# Patient Record
Sex: Female | Born: 1990 | Race: Black or African American | Hispanic: No | Marital: Single | Smoking: Former smoker
Health system: Southern US, Community
[De-identification: ages and names within clinical notes are randomized; demographics above are authoritative.]

## PROBLEM LIST (undated history)

## (undated) DIAGNOSIS — G35 Multiple sclerosis: Secondary | ICD-10-CM

## (undated) DIAGNOSIS — G8929 Other chronic pain: Secondary | ICD-10-CM

## (undated) HISTORY — PX: TONSILLECTOMY: SUR1361

## (undated) HISTORY — PX: TUBAL LIGATION: SHX77

## (undated) HISTORY — PX: CHOLECYSTECTOMY: SHX55

---
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
## 2010-07-29 ENCOUNTER — Observation Stay: Payer: Self-pay | Admitting: Surgery

## 2010-07-31 LAB — PATHOLOGY REPORT

## 2010-10-02 ENCOUNTER — Emergency Department: Payer: Self-pay | Admitting: Internal Medicine

## 2010-10-22 ENCOUNTER — Encounter: Payer: Self-pay | Admitting: Family Medicine

## 2010-10-28 ENCOUNTER — Encounter: Payer: Self-pay | Admitting: Family Medicine

## 2010-11-28 ENCOUNTER — Encounter: Payer: Self-pay | Admitting: Family Medicine

## 2010-12-29 ENCOUNTER — Encounter: Payer: Self-pay | Admitting: Family Medicine

## 2012-06-10 ENCOUNTER — Ambulatory Visit: Payer: Self-pay | Admitting: Ophthalmology

## 2012-06-27 ENCOUNTER — Inpatient Hospital Stay: Payer: Self-pay | Admitting: Obstetrics and Gynecology

## 2012-06-27 LAB — COMPREHENSIVE METABOLIC PANEL
Albumin: 2.6 g/dL — ABNORMAL LOW (ref 3.4–5.0)
Alkaline Phosphatase: 46 U/L — ABNORMAL LOW (ref 50–136)
Anion Gap: 8 (ref 7–16)
Bilirubin,Total: 0.2 mg/dL (ref 0.2–1.0)
Co2: 25 mmol/L (ref 21–32)
Creatinine: 0.68 mg/dL (ref 0.60–1.30)
EGFR (African American): >60
Glucose: 97 mg/dL (ref 65–99)
Potassium: 3.4 mmol/L — ABNORMAL LOW (ref 3.5–5.1)
SGPT (ALT): 44 U/L (ref 12–78)
Sodium: 141 mmol/L (ref 136–145)
Total Protein: 5.8 g/dL — ABNORMAL LOW (ref 6.4–8.2)

## 2012-06-27 LAB — LIPASE, BLOOD: Lipase: 66 U/L — ABNORMAL LOW (ref 73–393)

## 2012-06-27 LAB — AMYLASE: Amylase: 27 U/L (ref 25–115)

## 2012-06-28 LAB — CBC WITH DIFFERENTIAL/PLATELET
Basophil #: 0 10*3/uL (ref 0.0–0.1)
Basophil %: 0.1 %
Eosinophil #: 0 10*3/uL (ref 0.0–0.7)
Eosinophil %: 0.2 %
HGB: 9.6 g/dL — ABNORMAL LOW (ref 12.0–16.0)
Lymphocyte #: 1.7 10*3/uL (ref 1.0–3.6)
MCH: 28.8 pg (ref 26.0–34.0)
MCHC: 33 g/dL (ref 32.0–36.0)
MCV: 87 fL (ref 80–100)
Monocyte #: 0.9 x10 3/mm (ref 0.2–0.9)
Neutrophil #: 5.9 10*3/uL (ref 1.4–6.5)
Neutrophil %: 69.4 %
RBC: 3.35 10*6/uL — ABNORMAL LOW (ref 3.80–5.20)
RDW: 13.1 % (ref 11.5–14.5)

## 2012-06-28 LAB — URINALYSIS, COMPLETE
Bilirubin,UR: NEGATIVE
Blood: NEGATIVE
Glucose,UR: NEGATIVE mg/dL (ref 0–75)
Ketone: NEGATIVE
Ph: 9 (ref 4.5–8.0)
Protein: NEGATIVE
Specific Gravity: 1.01 (ref 1.003–1.030)
WBC UR: 2 /HPF (ref 0–5)

## 2012-06-28 LAB — PROTIME-INR: Prothrombin Time: 13 secs (ref 11.5–14.7)

## 2012-06-28 LAB — APTT: Activated PTT: 23.8 secs (ref 23.6–35.9)

## 2012-06-29 LAB — COMPREHENSIVE METABOLIC PANEL
Albumin: 2.3 g/dL — ABNORMAL LOW (ref 3.4–5.0)
Alkaline Phosphatase: 41 U/L — ABNORMAL LOW (ref 50–136)
BUN: 8 mg/dL (ref 7–18)
Creatinine: 0.53 mg/dL — ABNORMAL LOW (ref 0.60–1.30)
EGFR (African American): >60
EGFR (Non-African Amer.): >60
Glucose: 85 mg/dL (ref 65–99)
Potassium: 3.5 mmol/L (ref 3.5–5.1)
Sodium: 140 mmol/L (ref 136–145)
Total Protein: 5.4 g/dL — ABNORMAL LOW (ref 6.4–8.2)

## 2012-06-30 LAB — HEPATIC FUNCTION PANEL A (ARMC)
Albumin: 2.3 g/dL — ABNORMAL LOW (ref 3.4–5.0)
Alkaline Phosphatase: 43 U/L — ABNORMAL LOW (ref 50–136)
Bilirubin, Direct: 0.1 mg/dL (ref 0.00–0.20)
Bilirubin,Total: 0.4 mg/dL (ref 0.2–1.0)
SGOT(AST): 10 U/L — ABNORMAL LOW (ref 15–37)
SGPT (ALT): 33 U/L (ref 12–78)

## 2012-06-30 LAB — CBC WITH DIFFERENTIAL/PLATELET
Basophil %: 0.2 %
Eosinophil #: 0 10*3/uL (ref 0.0–0.7)
MCH: 28.6 pg (ref 26.0–34.0)
MCV: 86 fL (ref 80–100)
Neutrophil #: 6.8 10*3/uL — ABNORMAL HIGH (ref 1.4–6.5)
Neutrophil %: 73.2 %
RBC: 3.49 10*6/uL — ABNORMAL LOW (ref 3.80–5.20)
RDW: 13 % (ref 11.5–14.5)

## 2012-07-01 LAB — COMPREHENSIVE METABOLIC PANEL
Alkaline Phosphatase: 41 U/L — ABNORMAL LOW (ref 50–136)
BUN: 6 mg/dL — ABNORMAL LOW (ref 7–18)
Bilirubin,Total: 0.3 mg/dL (ref 0.2–1.0)
Calcium, Total: 7.7 mg/dL — ABNORMAL LOW (ref 8.5–10.1)
Chloride: 111 mmol/L — ABNORMAL HIGH (ref 98–107)
EGFR (African American): >60
Osmolality: 279 (ref 275–301)
Potassium: 3.3 mmol/L — ABNORMAL LOW (ref 3.5–5.1)
SGOT(AST): 11 U/L — ABNORMAL LOW (ref 15–37)
SGPT (ALT): 26 U/L (ref 12–78)
Total Protein: 5.3 g/dL — ABNORMAL LOW (ref 6.4–8.2)

## 2012-07-01 LAB — URINE CULTURE

## 2012-07-06 ENCOUNTER — Encounter: Payer: Self-pay | Admitting: Maternal & Fetal Medicine

## 2012-07-12 ENCOUNTER — Observation Stay: Payer: Self-pay | Admitting: Obstetrics and Gynecology

## 2012-07-12 LAB — URINALYSIS, COMPLETE
Blood: NEGATIVE
Glucose,UR: 50 mg/dL (ref 0–75)
Ketone: NEGATIVE
Nitrite: NEGATIVE
Protein: NEGATIVE
Specific Gravity: 1.014 (ref 1.003–1.030)
WBC UR: 1 /HPF (ref 0–5)

## 2012-07-12 LAB — FETAL FIBRONECTIN: Fetal Fibronectin: NEGATIVE

## 2012-07-27 ENCOUNTER — Encounter: Payer: Self-pay | Admitting: Maternal & Fetal Medicine

## 2012-08-17 ENCOUNTER — Encounter: Payer: Self-pay | Admitting: Maternal & Fetal Medicine

## 2012-09-09 ENCOUNTER — Ambulatory Visit: Payer: Self-pay | Admitting: Neurology

## 2012-09-13 ENCOUNTER — Observation Stay: Payer: Self-pay

## 2012-09-13 LAB — FETAL FIBRONECTIN
Appearance: NORMAL
Fetal Fibronectin: NEGATIVE

## 2012-09-13 LAB — URINALYSIS, COMPLETE
Protein: NEGATIVE
Specific Gravity: 1.004 (ref 1.003–1.030)

## 2012-09-17 ENCOUNTER — Inpatient Hospital Stay: Payer: Self-pay | Admitting: Obstetrics and Gynecology

## 2012-09-19 ENCOUNTER — Ambulatory Visit: Payer: Self-pay | Admitting: Neurology

## 2012-09-20 LAB — BASIC METABOLIC PANEL
Chloride: 108 mmol/L — ABNORMAL HIGH (ref 98–107)
Creatinine: 0.62 mg/dL (ref 0.60–1.30)
EGFR (African American): >60
Glucose: 155 mg/dL — ABNORMAL HIGH (ref 65–99)
Osmolality: 275 (ref 275–301)
Sodium: 137 mmol/L (ref 136–145)

## 2012-10-02 ENCOUNTER — Observation Stay: Payer: Self-pay | Admitting: Obstetrics and Gynecology

## 2012-10-06 ENCOUNTER — Inpatient Hospital Stay: Payer: Self-pay | Admitting: Obstetrics and Gynecology

## 2012-10-06 LAB — CBC WITH DIFFERENTIAL/PLATELET
Basophil #: 0 10*3/uL (ref 0.0–0.1)
Eosinophil #: 0.1 10*3/uL (ref 0.0–0.7)
HGB: 11.5 g/dL — ABNORMAL LOW (ref 12.0–16.0)
Lymphocyte #: 1.3 10*3/uL (ref 1.0–3.6)
MCHC: 33 g/dL (ref 32.0–36.0)
Monocyte %: 10.6 %
Neutrophil %: 73.2 %
WBC: 8.7 10*3/uL (ref 3.6–11.0)

## 2012-10-06 LAB — GC/CHLAMYDIA PROBE AMP

## 2012-10-06 LAB — RAPID HIV-1/2 QL/CONFIRM: HIV-1/2,Rapid Ql: NEGATIVE

## 2012-10-07 LAB — HEMATOCRIT: HCT: 29.8 % — ABNORMAL LOW (ref 35.0–47.0)

## 2012-11-16 ENCOUNTER — Ambulatory Visit: Payer: Self-pay | Admitting: Obstetrics and Gynecology

## 2012-11-16 LAB — HEMOGLOBIN: HGB: 12 g/dL (ref 12.0–16.0)

## 2012-11-16 LAB — PREGNANCY, URINE: Pregnancy Test, Urine: NEGATIVE m[IU]/mL

## 2012-11-20 ENCOUNTER — Ambulatory Visit: Payer: Self-pay | Admitting: Obstetrics and Gynecology

## 2012-12-25 ENCOUNTER — Ambulatory Visit: Payer: Self-pay | Admitting: Gastroenterology

## 2013-03-04 ENCOUNTER — Emergency Department: Payer: Self-pay | Admitting: Emergency Medicine

## 2013-06-07 ENCOUNTER — Emergency Department: Payer: Self-pay | Admitting: Emergency Medicine

## 2014-06-05 ENCOUNTER — Emergency Department: Payer: Self-pay | Admitting: Emergency Medicine

## 2014-08-19 NOTE — Discharge Summary (Signed)
PATIENT NAME:  Jillian Wells, Jillian Wells MR#:  [id number] DATE OF BIRTH:  20-Oct-1990  DATE OF ADMISSION:  09/17/2012 DATE OF DISCHARGE:  09/20/2012   HOSPITAL COURSE: This is a 24 year old gravida 2, para 1 patient with known multiple sclerosis, admitted for intravenous steroids for worsening optic neuritis.  Dr. Judie Bonus of neurology and hospitalist managing the patient while in the hospital. The patient did undergo daily nonstress test, which were reassuring normal fetal well-being. The patient was noted to have some intermittent contractions; however, her cervix was checked twice and did not show any cervical dilation The patient is discharged to home in good condition. She will be discharged  on prednisone as written by the hospitalist and will follow up with Dr. Sherryll Burger. Otherwise, she will continue prenatal care at [facility]. She has an appointment Wednesday for nonstress test this.    ____________________________ Suzy Bouchard, MD tjs:cc D: 09/20/2012 11:34:15 ET T: 09/20/2012 18:52:45 ET JOB#: [id number]  cc: Suzy Bouchard, MD, <Dictator> Suzy Bouchard MD ELECTRONICALLY SIGNED 09/24/2012 30:86

## 2014-08-19 NOTE — Consult Note (Signed)
PATIENT NAME:  Jillian Wells, Jillian Wells MR#:  [id number] DATE OF BIRTH:  [birth year]  DATE OF CONSULTATION:  09/19/2012  REFERRING PHYSICIAN:   CONSULTING PHYSICIAN:  Pauletta Browns, MD  BRIEF HISTORY OF PRESENT ILLNESS:  This is a 24 year old, left right-handed, African American female with past medical history of optic neuritis, newly diagnosed multiple sclerosis. First onset of optic neuritis in 05/2012 and has a new onset of left visual loss since on 08/2012. For current treatment she is receiving Solu-Medrol 500 mg twice a day and is to follow up with Dr. Sherryll Burger for the rest of the treatment.   PHYSICAL EXAMINATION:  VITAL SIGNS:  Include a temperature 98.4, pulse 105, blood pressure 128/82, heart rate 97.  MENTAL STATUS EXAMINATION:  She is alert, awake, oriented, follows 2-step commands. She is able to tell me the President of the [place]. Speech is fluent, attention and concentration are intact. Cranial nerve examination, she has anterior pupilar defect on the left eye. She has loss of vision in the left eye and absolutely no visual perception. On the right eye, visual fields appear to be intact. Visual sensation appears to be intact. Face symmetrical. Tongue is midline. Uvula elevates symmetrically. Motor strength 5/5 bilateral upper and lower extremities. Reflexes symmetrical, but diminished throughout, 1+. Sensation intact to light touch and temperature. Coordination:  Finger-to-nose intact. Gait not assessed.   IMPRESSION:  A 24 year old female with recurrent optic neuritis and multiple sclerosis being treated with Solu-Medrol.   PLAN:  Currently on Solu-Medrol 500 mg twice a day. This is her fourth dose. The last dose is going to be tomorrow morning, and she is going to be discharged with a prednisone 60 mg a day for a week, and then she is going to follow up with Dr. Sherryll Burger as outpatient. As outpatient, she is most likely going to be started on Tysabri, but prior to that she is going to be  sent for a JC virus antibody. Unfortunately JC viruses can be positive for close to 50% of the general population. There is a high risk of PML with Tysabri and positive JC virus, which is high chance of developing PML infusing Tysabri for 2 or more years.  It was a pleasure seeing this patient. Please call with any questions and the patient is to follow up with Dr. Sherryll Burger as above.   ____________________________ Pauletta Browns, MD yz:jm D: 09/19/2012 11:37:55 ET T: 09/19/2012 12:33:25 ET JOB#: [id number]  cc: Pauletta Browns, MD, <Dictator> Pauletta Browns MD ELECTRONICALLY SIGNED 09/25/2012 14:38

## 2014-08-19 NOTE — Consult Note (Signed)
Chief Complaint:  Subjective/Chief Complaint seen for right upper quadrant pain. less nausea, no vomiting.  currently pain is "6/10".  appears comfortable.  tolerating clears.   VITAL SIGNS/ANCILLARY NOTES: **Vital Signs.:   03-Mar-14 16:34  Vital Signs Type Routine  Temperature Temperature (F) 98  Celsius 36.6  Temperature Source oral  Pulse Pulse 80  Pulse source if not from Vital Sign Device apical  Respirations Respirations 18  Systolic BP Systolic BP 673  Diastolic BP (mmHg) Diastolic BP (mmHg) 73  Mean BP 88  Pulse Ox % Pulse Ox % 98  Pulse Ox Activity Level  At rest  Oxygen Delivery Room Air/ 21 %   Brief Assessment:  Cardiac Regular   Respiratory clear BS   Gastrointestinal details normal Soft  Nondistended  No masses palpable  Bowel sounds normal  No rebound tenderness  tender epigastrum to right upper quadrant.   Lab Results: Hepatic:  03-Mar-14 05:35   Bilirubin, Total 0.4  Alkaline Phosphatase  41  SGPT (ALT) 38  SGOT (AST) 22  Total Protein, Serum  5.4  Albumin, Serum  2.3  Routine Chem:  03-Mar-14 05:35   Glucose, Serum 85  BUN 8  Creatinine (comp)  0.53  Sodium, Serum 140  Potassium, Serum 3.5  Chloride, Serum  110  CO2, Serum 24  Calcium (Total), Serum  7.8  Osmolality (calc) 277  eGFR (African American) >60  eGFR (Non-African American) >60 (eGFR values <50m/min/1.73 m2 may be an indication of chronic kidney disease (CKD). Calculated eGFR is useful in patients with stable renal function. The eGFR calculation will not be reliable in acutely ill patients when serum creatinine is changing rapidly. It is not useful in  patients on dialysis. The eGFR calculation may not be applicable to patients at the low and high extremes of body sizes, pregnant women, and vegetarians.)  Anion Gap  6   Radiology Results: [place]    02-Mar-14 04:39, [place]dom[place]  [place]dom[place] Survey   REASON FOR EXAM:    abdominal pain  COMMENTS:   Body  Site: GB and Fossa, CBD, Head of Pancreas    PROCEDURE: [place] - [place]DOM[place] LIMITED SURVEY  - Jun 28 2012  4:39AM     RESULT: Comparison: None    Technique: Multiple gray-scale and color-flow Doppler images of the right   upper quadrant are presented for review.    Findings:    The liver is increased in echogenicity. The liver is enlarged measuring   19.8 cm in length. The liver is without evidence of a focal hepatic   lesion.   Prior cholecystectomy. There is no intra- or extrahepatic biliary ductal   dilatation. The common duct measures 3.1 mm in maximal diameter.     The visualized portion of the pancreas is normal in echogenicity.    IMPRESSION:     Prior cholecystectomy.    Hepatomegaly and hepatic steatosis.    Dictation Site: 1        Verified By: HJennette Banker M.D., MD    02-Mar-14 14:59, [place]ney Bilateral  [place] Bilateral   REASON FOR EXAM:    right costovertebral angle pain  COMMENTS:       PROCEDURE: [place] - [place]KIDNEY  - Jun 28 2012  2:59PM     RESULT: Comparison: None    Technique and findings: Multiple gray-scale and color doppler images of   the kidneys were obtained.     The right  kidney measures 13.3 x 6.9 x 5.2 cm and the left kidney   measures 13.6 x 5.6 x 5.4 cm. The kidneys are normal in echogenicity.   There is no left hydronephrosis. There is mild right pelvic caliectasis.    There are no echogenic foci.There are no renal masses. There is no free   fluid in the region of the renal fossa. Bilateral ureteral jets are noted.  IMPRESSION:      Mild right pelvicaliectasis.    Dictation Site: 1        Verified By: Jennette Banker, M.D., MD   Assessment/Plan:  Assessment/Plan:  Assessment 1) right upper quadrant, epigastric pain of uncertain etiology.  labs uninformative, US showing mild right pelviiectasis without obstruction. 2) 22 week grav.   Plan 1) will start miralax daily for constipation 2) bentyl 10 mg po q 8 hours prn pain 3)  recheck labs am case/above discussed with Dr Amalia Hailey.   Electronic Signatures: Loistine Simas (MD)  (Signed 03-Mar-14 19:55)  Authored: Chief Complaint, VITAL SIGNS/ANCILLARY NOTES, Brief Assessment, Lab Results, Radiology Results, Assessment/Plan   Last Updated: 03-Mar-14 19:55 by Loistine Simas (MD)

## 2014-08-19 NOTE — Discharge Summary (Signed)
PATIENT NAME:  [name], Jillian Wells MR#:  [id number] DATE OF BIRTH:  Jun 24, 1990  DATE OF ADMISSION:  06/27/2012 DATE OF DISCHARGE:  07/01/2012  ADMISSION DIAGNOSES: 1.  Twenty-two week gestation.  2.  Abdominal pain.  PRIMARY CAREGIVER:  Jennell Corner, MD   CONSULTATIONS: Gastroenterology - Barnetta Chapel, MD   HOSPITAL COURSE: The patient was admitted on March 1st with right upper quadrant pain similar to gallbladder pain and found to be status post cholecystectomy.  Ultrasound was unremarkable. She had persistent pain throughout her hospitalization, eased significantly over the 4 days. Labs normal. No evidence of ureteral issue by ultrasound. Kidney function appeared normal. GI function appeared normal.   Per GI, she was discharged home with pain management and to follow up with more specific radiological study should pain persist status post pregnancy.  ____________________________ Reatha Harps. Logan Bores, MD rle:sb D: 07/10/2012 07:29:44 ET T: 07/10/2012 07:41:19 ET JOB#: [id number]  cc: Ricky L. Logan Bores, MD, <Dictator> Augustina Mood MD ELECTRONICALLY SIGNED 07/10/2012 17:57

## 2014-08-19 NOTE — Consult Note (Signed)
PATIENT NAME:  Jillian Wells, Jillian Wells MR#:  [id number] DATE OF BIRTH:  [birth year]  DATE OF CONSULTATION:  09/17/2012   CONSULTING PHYSICIAN:  Makaiya Geerdes P. Juliene Pina, MD  PRIMARY CARE PHYSICIAN:  None.  REFERRING PHYSICIAN:  Dr. Veatrice Bourbon  REASON FOR REQUEST:  Optic neuritis.   REPORT OF CONSULTATION:  This is a 24 year old, gravida 2, para 1 female, who is [redacted] weeks pregnant, who has been having issues with her left eye for the past several weeks since Mother's Day. She was seen by Dr. Sherryll Burger. She was given prednisone for a week. However, she continues to have no vision from her left eye.  The diagnosis was optic neuritis. Hospitalist is consulted for management while hospitalized.  I have spoken with Dr. Sherryll Burger, who recommends Solu-Medrol 500 mg IV q. 12 hours x 3 days.   REVIEW OF SYSTEMS: CONSTITUTIONAL:  No fever, fatigue, weakness. She has had weight gain, basically due to her pregnancy.  EYES:  She is no vision in the left eye.  EARS, NOSE, THROAT:  No ear pain, hearing loss, seasonal allergies, snoring, postnasal drip.  RESPIRATORY: No cough, wheezing, hemoptysis, dyspnea, COPD.  CARDIOVASCULAR: No chest pain, orthopnea, edema, arrhythmia, dyspnea on exertion, palpitations, syncope.  GASTROINTESTINAL:  No nausea, vomiting, diarrhea, abdominal pain, melena, ulcers, GERD. GENITOURINARY:   No dysuria or hematuria.  ENDOCRINE:  No polyuria, polydipsia. HEMATOLOGIC/LYMPHATIC:  No anemia, easy bruising.  SKIN:  No rash or lesions.  MUSCULOSKELETAL:  No pain in the shoulders or knees. No limited activity.  NEUROLOGIC:  No history of CVA, TIA, seizures.  PSYCHIATRIC:  No history of anxiety or depression.  PAST MEDICAL HISTORY:  Multiple sclerosis.   MEDICATIONS:  Prenatal vitamins.   PAST SURGICAL HISTORY:  1.  Cholecystectomy.  2.  Tonsillectomy.   ALLERGIES:  No known drug allergies.   SOCIAL HISTORY:  No tobacco, alcohol or drug use. She quit smoking last October.   FAMILY HISTORY:  Positive  for hypertension.   PHYSICAL EXAMINATION: VITAL SIGNS:  Temperature 98.2, pulse is 102, respirations 20, blood pressure 136/82, 97% on room air.  GENERAL:  The patient is alert, oriented, not in acute distress.  HEENT:  Head is atraumatic. Pupils are round and reactive. She cannot see from the left eye.  Oropharynx is clear. No exudates are noted. Mucous membranes are moist.  NECK:  Supple, without JVD, carotid bruit, or enlarged thyroid.   CARDIOVASCULAR: Tachycardia without murmur, gallops or rubs. PMI is hard to palpate due to body habitus.  LUNGS:  Clear to auscultation without crackles, rales, rhonchi or wheezing. Symmetrical rise and fall of the chest.  ABDOMEN:  She has a pregnant belly. Denied appreciated bowel sounds due to her pregnancy. EXTREMITIES:  No clubbing, cyanosis, edema.  NEUROLOGIC:  Cranial nerves II through XII are intact, with the exception of the left eye, from which she is unable to see. MUSCULOSKELETAL:   Strength 5/5 in all extremities.   LABORATORIES:   No laboratories at this time.   ASSESSMENT AND PLAN:  A 24 year old female with a history of multiple sclerosis, who presents with optic neuritis.   1.  Optic neuritis secondary to MS flare. Per Dr. Sherryll Burger, we will continue with Solu-Medrol 500 mg IV q. 12 hours x 3 days. It should be noted,  she says that during her last hospitalization when she received Solu-Medrol, on the third day she did have some abdominal pain that radiated to the back, so we will continue to monitor for these symptoms.  2.  Tachycardia, likely secondary to pregnancy and anxiety from being in the hospital. Will monitor carefully.   3.   The patient is FULL CODE STATUS.   Thank you for allowing [place] to participate in the care of this patient. We will continue to follow.   Time spent on this consult:  45 minutes.   ____________________________ Janyth Contes. Juliene Pina, MD spm:mr D: 09/17/2012 17:41:15 ET T: 09/17/2012 19:08:09  ET JOB#: [id number]  cc: Kyarra Vancamp P. Juliene Pina, MD, <Dictator> Janyth Contes Jamarian Jacinto MD ELECTRONICALLY SIGNED 09/17/2012 21:49

## 2014-08-19 NOTE — Consult Note (Signed)
Brief Consult Note: Diagnosis: right upper quadrant pain.   Patient was seen by consultant.   Consult note dictated.   Recommend further assessment or treatment.   Orders entered.   Discussed with Attending MD.   Comments: Please see fullGI consult.  Patient brought to the hospital with c/o ruq pain starting acutely 3 days ago the day after starting treatment for recent Dx of multiple sclerosis. US showing hepatomegally and fatty liver, lfts normal, cbc normal.  Will check ua and renal US in addition ot current evaluation.  Patietn is comfortable and in no acute distress.  Recommend also involving her neurologist in her care tomorrow (Dr Cristopher Peru).  Following.  Disucssed with Dr Feliberto Gottron.  Electronic Signatures: Barnetta Chapel (MD)  (Signed 02-Mar-14 14:58)  Authored: Brief Consult Note   Last Updated: 02-Mar-14 14:58 by Barnetta Chapel (MD)

## 2014-08-19 NOTE — Consult Note (Signed)
No Known Allergies:    Impression 24 y/o f G2 P1 here with optic neuritis flareof MS   Plan per dr Sherryll Burger neuro solumderol 50 mGIV q12 she says last time she got this the last day she had abdominal pain/[place] will monitor  thank yuo will follow   Electronic Signatures for Addendum Section:  Adrian Saran (MD) (Signed Addendum [phone] 17:41)  [phone]   Electronic Signatures: Adrian Saran (MD)  (Signed 22-May-14 17:36)  Authored: Home Medications, Allergies, Impression/Plan   Last Updated: 22-May-14 17:41 by Adrian Saran (MD)

## 2014-08-19 NOTE — Consult Note (Signed)
Referral Information:  Reason for Referral 24 yo G2P1001 at 29/4 weeks by LMP (01/23/12) c/w [place] at [facility] on 06/16/12; measurements were 20w 5 days.  She has a new diagnosis of Multiple Sclerosis, BMI 40, chronic hypertension, migraines, asthma, fatty liver and chronic right upper quadrant pain.  Her RUQ pain has continued to be severe (describes background 3/10 pain and daily 9/10 pain levels) She continues to use Vicodan q 4-6 hours daily depending on pain severity.  She met with GI but was told no further intervention/evaluation (normal lfts and liver ultrasound) could occur at this time due to pregnancy concerns.  She's here with her mom today and they are both somewhat frustrated because of her chronic, severe, daily pain.  She is unable to hug/interact normally with her 45 year old daughter due to the pain and is concerned about her persistent need for narcotics in order to function daily.   Referring Physician [facility]   Prenatal Hx MS--diagnosed 05/2012 following eppisode of acute onset left periorbital pain, blurred vision and left side weakness---MRI demonstrated periventricular hyperintensity lesions perpendicular to corpus callosum and juxtacortical lesions, multpile lesions enhanced with gadolinium.  She received Solumedrol 135m bid x 3 days, followed by a prednisone taper. She reports improvement in her symptoms.  She has follow up with her neurologist next month.   Past Obstetrical Hx 07/2007, 38 weeks, female, 6lbs 7 oz, complicated by preeclampsia   Home Medications: Medication Instructions Status  PreNata Prenatal Multivitamins oral tablet 1 tab(s) orally once a day Active  Vitamin D3 1000 intl units oral tablet 1 tab(s) orally once a day Active   Allergies:   No Known Allergies:   Vital Signs/Notes:  Nursing Vital Signs: **Vital Signs.:   21-Apr-14 09:22  Vital Signs Type Routine  Temperature Temperature (F) 97  Celsius 36.1  Temperature Source oral  Pulse  Pulse 86  Respirations Respirations 16  Systolic BP Systolic BP 1259 Diastolic BP (mmHg) Diastolic BP (mmHg) 57  Mean BP 83   Perinatal Consult:  PMed Hx Rubella Immune, Hx of varicella   Past Medical History cont'd 2. Chronic hypertension--?preeclampsia--she reports use of antihypertensive therapy following pregnancy complicated by preeclampsia, however, has not required any treatment since. 3. Fatty liver/ruq pain--normal lfts 06/26/12.  Ultrasound from 07/01/12 demonstrated hepatomegaly (19.8 cm in length), no focal hepatic lesions noted, no ductal dilation, +hepatic steatosis. see above for current complaints.  4. Asthma--stable 5. 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  We focused on her active issue of continued, right upper quadrant pain. Please see her previous mfm consultation for recommendations related to additional issues below:  1. MS--We addressed the general impact of pregnancy on MS--typically women experience improvement in clinical symptoms and may experience flares postpartum.  She reports improvement in her visual symptoms.  She received steroids and we addressed their  common use in pregnancy and overall safety.  2. BMI 40--we addressed concerns associated with obesity in pregnancy and reviewed IOM weight gain goals (15-20 lbs) in  pregnancy.   We discussed risks for gestational diabetes and pregnancy surveillance (as outlined below).  3. Hypertension, mild--she was familiar with the concerns assoicated with chronic hypertension in pregnancy. She had preeclampsia in her last pregnancy and was familiar with the disease.  We addressed pregnancy surveillance for growth restriction with serial growth ultrasounds and antenatal testing in the third trimester.  4. Chronic right upper quadrant pain on narcotics for pain relief.  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   5. Fatty liver--possibly due to obesity.  This condition is not associated with an increased risk for pregnancy related live disease (eg. AFLP) 6. Asthma-stable   Recommendations 1.  She may benefit from referral back to her primary surgeon for their opinion.  2.  I agree with your concerns related to this persistent post-cholecystectomy pain/ symptom.  I have referred her to [facility] to obtain their input.  She is scheduled to see them next week.  They have consulted with many of our pregnant patients in the past, and will evaluate her as needed for underlying pathology.  They can contact our maternal fetal medicine group while she is at [facility] if questions arise related to any additional testing or procedures needed.  I have advised [place] to go to the hospital if she experiences persistent, severe (9/10) pain.  She is reluctant to do this because the symptoms typically resolve after 1-2 days.    Agree with continued follow up with her Neurologist, Dr. Manuella Ghazi.  The plan is for her to receive immunomodulator therapy after delivery. Symptoms are stable.   Plan:  Ultrasound at what gestational ages Monthly > 28 weeks   Antepartum Testing Starting at 32 weeks, Weekly   Delivery Mode Vaginal   Additional Testing Thyroid panel,  Folate/prenatal vitamins    Total Time Spent with Patient 30 minutes   >50% of visit spent in couseling/coordination of care yes   Office Use Only [place]  Office Visit Level 4 (104mn) EST detailed office/outpt   Electronic Signatures: SManfred Shirts(MD)  (Signed 21-Apr-14 13:13)  Authored: Referral, Home Medications, Allergies, Vital Signs/Notes, Consult, Exam, Impression, Plan, Billing   Last Updated: 21-Apr-14 13:13 by SManfred Shirts(MD)

## 2014-08-19 NOTE — Consult Note (Signed)
Chief Complaint:  Subjective/Chief Complaint seen for ruq abdominalpain.  Currently no n/v but continues with ruq pain that she characterizes as 6/0/  appears comfortable.  patient states that  meds seem to be starting ot work . had bm yesterday.  no black or bloody stool   VITAL SIGNS/ANCILLARY NOTES: **Vital Signs.:   05-Mar-14 12:02  Vital Signs Type Routine  Temperature Temperature (F) 98.1  Celsius 36.7  Temperature Source oral  Pulse Pulse 84  Respirations Respirations 18  Systolic BP Systolic BP 102  Diastolic BP (mmHg) Diastolic BP (mmHg) 74  Mean BP 89   Brief Assessment:  Cardiac Regular   Respiratory clear BS   Gastrointestinal details normal Soft  Nondistended  No masses palpable  Bowel sounds normal  No rebound tenderness  obese/gravid, tender to palpation in the epigastrum and ruq, though less than several days ago.   Lab Results: Hepatic:  05-Mar-14 05:29   Bilirubin, Total 0.3  Alkaline Phosphatase  41  SGPT (ALT) 26  SGOT (AST)  11  Total Protein, Serum  5.3  Albumin, Serum  2.3  Routine Chem:  05-Mar-14 05:29   Glucose, Serum 94  BUN  6  Creatinine (comp)  0.32  Sodium, Serum 141  Potassium, Serum  3.3  Chloride, Serum  111  CO2, Serum 25  Calcium (Total), Serum  7.7  Osmolality (calc) 279  eGFR (African American) >60  eGFR (Non-African American) >60 (eGFR values <16m/min/1.73 m2 may be an indication of chronic kidney disease (CKD). Calculated eGFR is useful in patients with stable renal function. The eGFR calculation will not be reliable in acutely ill patients when serum creatinine is changing rapidly. It is not useful in  patients on dialysis. The eGFR calculation may not be applicable to patients at the low and high extremes of body sizes, pregnant women, and vegetarians.)  Anion Gap  5   Radiology Results: [place]    02-Mar-14 04:39, [place]  [place]   REASON FOR EXAM:    abdominal pain  COMMENTS:    Body Site: GB and Fossa, CBD, Head of Pancreas    PROCEDURE: [place] - [place] LIMITED SURVEY  - Jun 28 2012  4:39AM     RESULT: Comparison: None    Technique: Multiple gray-scale and color-flow Doppler images of the right   upper quadrant are presented for review.    Findings:    The liver is increased in echogenicity. The liver is enlarged measuring   19.8 cm in length. The liver is without evidence of a focal hepatic   lesion.   Prior cholecystectomy. There is no intra- or extrahepatic biliary ductal   dilatation. The common duct measures 3.1 mm in maximal diameter.     The visualized portion of the pancreas is normal in echogenicity.    IMPRESSION:     Prior cholecystectomy.    Hepatomegaly and hepatic steatosis.    Dictation Site: 1        Verified By: HJennette Banker M.D., MD    02-Mar-14 14:59, [place] Bilateral  [place] Bilateral   REASON FOR EXAM:    right costovertebral angle pain  COMMENTS:       PROCEDURE: [place] - [place]IDNEY  - Jun 28 2012  2:59PM     RESULT: Comparison: None    Technique and findings: Multiple gray-scale and color doppler images of   the kidneys were obtained.     The  right kidney measures 13.3 x 6.9 x 5.2 cm and the left kidney   measures 13.6 x 5.6 x 5.4 cm. The kidneys are normal in echogenicity.   There is no left hydronephrosis. There is mild right pelvic caliectasis.    There are no echogenic foci.There are no renal masses. There is no free   fluid in the region of the renal fossa. Bilateral ureteral jets are noted.  IMPRESSION:      Mild right pelvicaliectasis.    Dictation Site: 1        Verified By: Jennette Banker, M.D., MD   Assessment/Plan:  Assessment/Plan:  Assessment 1) right upper quadrant of uncertain etiology, some improvement with bentyl c/w possible biliary spasm?.  labs.[place] otherwise not informative.  2) constipation-improving on miralax.   Plan 1) continue bentyl 10 mg po q 6 hours prn 2) continue  miralax, one dose daily 3) fu gi in 2 weeks, patient cautioned to call MD or come to hospital if worse.   Electronic Signatures: Loistine Simas (MD)  (Signed 05-Mar-14 17:38)  Authored: Chief Complaint, VITAL SIGNS/ANCILLARY NOTES, Brief Assessment, Lab Results, Radiology Results, Assessment/Plan   Last Updated: 05-Mar-14 17:38 by Loistine Simas (MD)

## 2014-08-19 NOTE — Consult Note (Signed)
PATIENT NAME:  JAMIRAH, Jillian Wells MR#:  [id number] DATE OF BIRTH:  Oct 12, 1990  DATE OF CONSULTATION:  09/17/2012  CONSULTING PHYSICIAN:  Ricky L. Logan Bores, MD  REASON FOR ADMISSION:   1.  IV steroids to help manage neuritis associated with multiple sclerosis.  2.  Pregnancy at 34 weeks.   ATTENDING PHYSICIAN:  Ricky L. Logan Bores, MD for pregnancy only.  I am the admitting for medicolegal reasons, assisting the internal medicine service and hospitalists.     PAST MEDICAL HISTORY: Obesity and multiple sclerosis recently diagnosed this year and asthma. She had some right upper quadrant pain, possible fatty liver,  possible gallbladder complication of unsure etiology. Plan workup post-delivery.    FAMILY HISTORY:   Noncontributory.   REVIEW OF SYSTEMS: Continues to have right upper quadrant pain. She had some visual loss, which has been followed by Dr. Cristopher Peru.    PHYSICAL EXAMINATION:   VITAL SIGNS:  As noted on the chart.   GENERAL: Pleasant black female in no acute distress.   HEENT:  Visual changes have been follow closely by neurologist. Fetal heart tones auscultated in the 140s.  ABDOMEN: Soft, nontender gravid.  BREASTS: Deferred.   IMPRESSION: Complication of multiple sclerosis in 34 week pregnancy.   PLAN:  1.  We will do fetal heart tones daily.   2.   Vitals every 6 hours.  3.  Regular diet.   4.  Resting orders per hospitalist/neurologist.   ____________________________ Reatha Harps. Logan Bores, MD rle:cc D: 09/17/2012 16:50:33 ET T: 09/17/2012 19:42:01 ET JOB#: [id number]  cc: Ricky L. Logan Bores, MD, <Dictator> Augustina Mood MD ELECTRONICALLY SIGNED 09/22/2012 17:37

## 2014-08-19 NOTE — Consult Note (Signed)
PATIENT NAME:  Jillian Wells, Jillian Wells MR#:  [id number] DATE OF BIRTH:  April 10, 1991  GASTROINTESTINAL CONSULTATION REPORT  DATE OF CONSULTATION:  06/28/2012  Patient of Dr. Ouida Sills.   REASON FOR CONSULTATION: Right upper quadrant pain.   HISTORY OF PRESENT ILLNESS: The patient is a 24 year old female who is G2 P2-0-0-1. She is currently at 22 weeks' gestation. She had presented to her GYN as an outpatient with some right upper quadrant pain. This got worse over the past 24 hours, and she was admitted to the hospital for further evaluation.   She states that this past Tuesday due to a recent diagnosis of multiple sclerosis she was started on a steroid treatment, this being Solu-Medrol 250 mg IV b.i.d. for 3 days. She is currently on an oral prednisone.   She stated that the treatment began on Tuesday. She started to have right upper quadrant abdominal pain on Wednesday. It is on the right side. Seems to be encompassing the right upper as well as lower quadrants. There has been some mild nausea, but no vomiting. There is what appears to be a radiation to the back. The patient appears to be quite comfortable, however, when asked what the severity was currently she stated a 9 out of 10.   She does have a history of a laparoscopic cholecystectomy being done 26/71/2458 that was uncomplicated.   She denies any problems with heartburn, although she does take Nexium on a regular basis for reflux issues that started with her previous pregnancy about 4 years ago. She has no dysphagia. She usually has a daily bowel movement, however, this has been more so variable recently, and she states her last bowel movement was about 3 days ago. She has noted some increasing constipation recently.   PAST MEDICAL HISTORY: 1. History of asthma.  2. Recent diagnosis of multiple sclerosis, as stated.  3. Gastroesophageal reflux.  4. History of migraine headaches.  5. Hypertension.  6. Insomnia.  7. Depression.  8.  Cholecystectomy, as stated.  9. Tonsillectomy.   REVIEW OF SYSTEMS: Per admitting history and physical, negative with the exception of above. Some vision changes, felt to be consistent with multiple sclerosis as noted.   PATIENT'S OUTPATIENT MEDICATIONS: Include prednisone 1 mg once a day, prenatal vitamins and vitamin D3 1000 international units daily. She also states she takes Nexium daily.   There are no known drug allergies.   PHYSICAL EXAMINATION: VITAL SIGNS: Temperature is 98.7, pulse 78, respirations 18, blood pressure 121/69, pulse ox  98%.  GENERAL: She is a 24 year old [place] American female, no acute distress. She appears to be quite comfortable sitting upright in the bed, mother at bedside.  HEENT: Normocephalic, atraumatic. Eyes: Are anicteric. Nose: Septum midline. No lesions.  Oropharynx: No lesions.  NECK: Supple. No JVD. No lymphadenopathy. No thyromegaly.  HEART: Regular rate and rhythm.  LUNGS: Clear.  ABDOMEN: Soft. She is tender to palpation on the right side of the abdomen, more so about  7 to 8 cm below the costal margin. There is some tenderness that also goes around to the right costovertebral angle, and indeed she is a little tender to percussion in the vertebral area centrally on that same plane. She relates no numbness. Bowel sounds positive, normoactive.  EXTREMITIES: No clubbing, cyanosis or edema.  NEUROLOGICAL: Cranial nerves II-XII grossly intact. Muscle strength bilaterally equal and symmetric. DTRs bilaterally equal and symmetric.   LABORATORIES INCLUDE THE FOLLOWING: On admission to the hospital she had a glucose of 97, BUN 10,  creatinine 0.68, sodium 141, potassium 3.4, chloride 108, bicarb 25, calcium 7.9, amylase 27, lipase 66.   Hepatic profile with a total protein of 5.8, albumin 2.6, total bilirubin 0.2, alk phos low at 46, AST 24, ALT 42.   Her hemogram showing a white count of 8.5, H and H  9.6/29.3, platelet count of 193. MCV is 87, protime was  13.0, INR of 1.0, activated PTT was 23.8.   Her abdominal ultrasound, which was done as a limited survey, showed a common duct at 3.1 mm. Liver without evidence of focal lesion, however, there was some evidence of some hepatomegaly as well as hepatic steatosis. A prior cholecystectomy. Visualized portions of the pancreas were normal.   ASSESSMENT: Right upper quadrant pain of relatively acute onset. The patient has had a previous cholecystectomy. Ultrasound indicates no evidence of biliary ductal dilatation or other lesions with the exception of some hepatomegaly and fatty liver. She is relatively more early in the course of her pregnancy in regards to a concern of acute fatty liver of pregnancy, and her LFTs are normal. She does have pain that goes into the right costovertebral angle also, that is centrally in the vertebral column region as well. Differential diagnosis would include nephrolithiasis, kidney disorder, possible biliary spasm, radicular pain.   RECOMMENDATIONS: 1. Will obtain a urinalysis.  2. Repeat the ultrasound in regards to kidneys and right costovertebral angle tenderness.  3.  In regards to the patient's recent diagnosis with multiple sclerosis, I questioned both her and her mother at length about the dosing of the dexamethasone that she was given at home, and they were insistent that it was 250 mg twice a day. This seems to be a bit high, although I am uncertain of what may have been used in regards to this issue. I would recommend that we involve Dr. Manuella Ghazi in her care when he returns tomorrow. Currently she is on a low dose of steroid as a tapering while in the hospital.   Case discussed further with Dr. Ouida Sills.      ____________________________ Lollie Sails, MD mus:dm D: 06/28/2012 14:54:00 ET T: 06/28/2012 15:27:22 ET JOB#: [id number]  cc: Lollie Sails, MD, <Dictator> Lollie Sails MD ELECTRONICALLY SIGNED 07/15/2012 18:21

## 2014-08-19 NOTE — Consult Note (Signed)
Referral Information:  Reason for Referral 24 yo G2P1001 at 23/4 weeks by LMP (01/23/12) c/w [place] at [facility] on 06/16/12; measurements were 20w 5 days.  She has a new diagnosis of Multiple Sclerosis, BMI 40, chronic hypertension, migraines, asthma, fatty liver and chronic right upper quadrant pain.   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  She received Solumedrol  bid x 3 days, followed by a prednisone taper. She reports improvement in her symptoms.   Past Obstetrical Hx 07/2007, 38 weeks, female, 6lbs 7 oz, complicated by preeclampsia   Home Medications: Medication Instructions Status  PreNata Prenatal Multivitamins oral tablet 1 tab(s) orally once a day Active  Vitamin D3 1000 intl units oral tablet 1 tab(s) orally once a day Active  hydrocodone 100 milligram(s) orally 2 times a day, As Needed- for Pain  Active   Allergies:   No Known Allergies:   Vital Signs/Notes:  Nursing Vital Signs: **Vital Signs.:   10-Mar-14 11:09  Vital Signs Type Routine  Temperature Temperature (F) 96.8  Celsius 36  Temperature Source oral  Pulse Pulse 81  Pulse source if not from Vital Sign Device dinamap  Respirations Respirations 16  Systolic BP Systolic BP 113  Diastolic BP (mmHg) Diastolic BP (mmHg) 61  Mean BP 78  BP Source  if not from Vital Sign Device non-invasive   Perinatal Consult:  PMed Hx Rubella Immune, Hx of varicella   Past Medical History cont'd 2. Chronic hypertension--?preeclampsia--she reports use of antihypertensive therapy following pregnancy complicated by preeclampsia, however, has not required any treatment since. 3. Fatty liver/ruq pain--normal lfts 06/26/12.  Ultrasound from 07/01/12  demonstrated hepatomegaly (19.8 cm in length), no focal hepatic lesions noted, no ductal dilation, +hepatic steatosis 4. Asthma--stable 5. 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 1. MS--We addressed the general impact of pregnancy on MS--typically women experience improvement in clinical symptoms and may experience flares postpartum.  She reports improvement in her visual symptoms.  She received steroids and we addressed their common use in pregnancy and overall safety.  2. BMI 40--we addressed concerns associated with obesity in pregnancy and reviewed IOM weight gain goals (15-20 lbs) in pregnancy.   We discussed risks for gestational diabetes and pregnancy surveillance (as outlined below).  3. Hypertension, mild--she was familiar with the concerns assoicated with chronic hypertension in pregnancy. She had preeclampsia in her last pregnancy and was familiar with the disease.  We addressed pregnancy surveillance for growth restriction with serial growth ultrasounds and 4. Chronic right upper quadrant pain on narcotics for pain relief.  We discussed the need for additional evaluation (she will see GI next week) to determine underlying etiology of this  pain.  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   5. Fatty liver--possibly due to obesity.  This condition is not associated with an increased risk for pregnancy related live disease (eg. AFLP) 6. Asthma-stable   Recommendations 1. Agree with continued follow up with her Neurologist, Dr. Sherryll Burger.  The plan is for her to receive immunomodulator therapy after delivery. Symptoms are stable. 2. She may benefit from nutrition consultation due to her BMI and to evaluate the quality of caloric intake.  She reports increase in weight gain when receiving steroids and weight loss after discontinuation of her steroid course. She believes her appetite has decreased.  Recommend baseline TSH, if not already performed.  We discussed early glucola for GDM screening.  3. Recommend serial growth evaluations for fetal growth assessment (as outlined below) and weekly antenatal testing beginning at 32 weeks due to chronic hypertension.  Please obtain baseline ECG and p:c ratio if not already performed.   4. Right upper quadrant pain--she and her mother are very concerned about her continued need for narcotics to treat this pain.  She is planning to to see GI next week.  They would like to follow up with MFM to discuss this problem--in event she needs any tests or procedures that cannot be performed here due to concerns for the pregnancy, we can help facilitate follow up at [facility].  Ideally, an alternative therapy could prevent neonatal withdrawl.   Plan:  Ultrasound at what gestational ages Monthly > 28 weeks   Antepartum Testing Starting at 32 weeks, Weekly   Delivery Mode Vaginal   Additional Testing Thyroid panel, Folate/prenatal vitamins    Total Time Spent with Patient 60 minutes   >50% of visit spent in couseling/coordination of care yes   Office Use Only 99[address]  Level 4 ( ) NEW office consult low complexity   Coding Description: MATERNAL CONDITIONS/HISTORY INDICATION(S).   Asthma.   Chronic  HTN.   OTHER: multiple sclerosis.  Electronic Signatures: Consuelo Pandy (MD)  (Signed 10-Mar-14 15:24)  Authored: Referral, Home Medications, Allergies, Vital Signs/Notes, Consult, Exam, Impression, Plan, Billing, Coding Description   Last Updated: 10-Mar-14 15:24 by Consuelo Pandy (MD)

## 2014-08-19 NOTE — Consult Note (Signed)
Chief Complaint:  Subjective/Chief Complaint still with ruq pain, somewhat less than yesterday.  Can't tell if bentyl is helping yet.  no emesis, mild nausea, no bm, passing flatus.   VITAL SIGNS/ANCILLARY NOTES: **Vital Signs.:   04-Mar-14 16:05  Vital Signs Type Routine  Temperature Temperature (F) 98.3  Celsius 36.8  Temperature Source oral  Pulse Pulse 77  Respirations Respirations 18  Systolic BP Systolic BP 117  Diastolic BP (mmHg) Diastolic BP (mmHg) 72  Mean BP 87  BP Source  if not from Vital Sign Device non-invasive  Pulse Ox % Pulse Ox % 99  Pulse Ox Activity Level  At rest  Oxygen Delivery Room Air/ 21 %   Brief Assessment:  Cardiac Regular   Respiratory clear BS   Gastrointestinal details normal Soft  Nondistended  No masses palpable  Bowel sounds normal  No rebound tenderness  tneder to palpation ruq/right abdomen.   Lab Results: Hepatic:  04-Mar-14 06:36   Bilirubin, Total 0.4  Bilirubin, Direct 0.1 (Result(s) reported on 30 Jun 2012 at 07:37AM.)  Alkaline Phosphatase  43  SGPT (ALT) 33  SGOT (AST)  10  Total Protein, Serum  5.5  Albumin, Serum  2.3  Routine Hem:  04-Mar-14 06:36   WBC (CBC) 9.2  RBC (CBC)  3.49  Hemoglobin (CBC)  10.0  Hematocrit (CBC)  30.2  Platelet Count (CBC) 219  MCV 86  MCH 28.6  MCHC 33.1  RDW 13.0  Neutrophil % 73.2  Lymphocyte % 16.7  Monocyte % 9.9  Eosinophil % 0.0  Basophil % 0.2  Neutrophil #  6.8  Lymphocyte # 1.5  Monocyte # 0.9  Eosinophil # 0.0  Basophil # 0.0 (Result(s) reported on 30 Jun 2012 at 07:37AM.)   Assessment/Plan:  Assessment/Plan:  Assessment 1) right abdominal pain. minimal improvement, etiology obscure.  [place] not informative, unable to do ct/xray imaging due to pregnancy. 2) constipation-no bm yet, but on miralax.   Plan 1) continue current.  no new recs.   Electronic Signatures: Barnetta Chapel (MD)  (Signed 04-Mar-14 19:41)  Authored: Chief Complaint, VITAL SIGNS/ANCILLARY NOTES,  Brief Assessment, Lab Results, Assessment/Plan   Last Updated: 04-Mar-14 19:41 by Barnetta Chapel (MD)

## 2014-08-19 NOTE — Consult Note (Signed)
PATIENT NAME:  Jillian Wells, Jillian Wells MR#:  [id number] DATE OF BIRTH:  1990/12/31  DATE OF CONSULTATION:  09/17/2012  REFERRING PHYSICIAN:   Sital P. Juliene Pina, MD  CONSULTING PHYSICIAN:  Hemang K. Sherryll Burger, MD OBSTETRIC AND GYNECOLOGY PHYSICIAN:  Madelin Headings, MD   REASON FOR CONSULTATION: Loss of vision in patient with multiple sclerosis who is pregnant.   HISTORY OF PRESENT ILLNESS: Ms. Valenti 24 year old left-handed African American female who had her first problem with optic neuritis back in February of 2014 and was given steroids and had MRI of the brain which was consistent with demyelinating lesions.   After her MRI, the patient revealed that she was pregnant to her mother, and the patient was also found to have quarter moon sports on her dilated fundus exam, and there was a concern for vasculitis, and extensive blood work was done which did not show any type of vascularity changes. The patient had an evaluation by [facility] physician as well who confirmed that this seems to be optic neuritis and  multiple sclerosis.   Unfortunately, around May 2014, the patient developed sudden vision loss completely from her left eye. The patient received a repeat MRI which did not show any new lesion.  Gadolinium was not given because the patient is pregnant.   A lumbar puncture been done either because of her being pregnant and a very classical finding of multiple sclerosis on her MRI as well as history.   The patient does not have a Uhthoff's phenomenon or any other focal neurological deficit except the patient has a feeling of fatigue and cognitive impairment.   It was decided the patient should receive IV Solu-Medrol in hospital because the patient is not able to tolerate it at home, and it is risky because of her being pregnant; and in the past she had to be hospitalized after IV Solu-Medrol due to abdominal pain.   I greatly appreciate Dr. Logan Bores' and Dr. Camillia Herter help in managing this patient.   PAST  MEDICAL HISTORY: Positive for multiple sclerosis and being pregnant.   PAST SURGICAL HISTORY: Cholecystectomy and tonsillectomy.   MEDICATIONS:  She just takes prenatal vitamins.   ALLERGIES:  She does not have any known drug allergies.   SOCIAL HISTORY: Significant that she does not drink alcohol or use tobacco or drugs. She quit smoking in October.   FAMILY HISTORY: Positive for hypertension.   REVIEW OF SYSTEMS: Positive for loss of vision in the left eye and transient blurriness of the vision in the right eye associated with the headaches.   Other 10-system review of system was asked and was found to be negative other than pregnancy-related back pain and other issues.   PHYSICAL EXAMINATION: VITAL SIGNS: Temperature was 98.2, pulse 102, respiratory rate 20, blood pressure 136/82, pulse oximetry 97%.  GENERAL: She is a young [place] female, morbidly obese, lying in bed surrounded by family members.  LUNGS: Clear to auscultation.  HEART: S1, S2 heart sounds. Carotid exam did not reveal any bruit.   MENTAL STATUS EXAM:  She was alert, oriented, followed 2-step commands. She was with appropriate affect.   Her attention and concentration seems to be appropriate. She does not seem to have any language difficulty or neglect.   On her cranial nerves, her left pupil had afferent pupillary defect, and she has no visual perception on the left side.  On the right side, her visual field seems to be okay on confrontation testing.   Her face was symmetric. Tongue  was midline. Facial sensations were intact.  On her motor exam, she has normal tone. Her strength is 5 out of 5.  Reflexes seem to be symmetric. Her sensations were intact to light touch. I did not check her gait.   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 I will give her IV Solu-Medrol 500 mg twice a day for 3 days and then prednisone 60 mg for 7 days but Dr. Ronalee Red, [facility].    The patient should get JC virus antibody testing done as an outpatient by [facility].  I will arrange as an outpatient.   I talked to the mother and other family member about the long-term plan of considering Tysabri (natalizumab) as a treatment after she delivers as after delivery the chances of MS exacerbation goes higher. The JC virus is being done to stratify her risk of development of PML (progressive multifocal leukoencephalopathy, which is a viral infection which we do not have treatment for, which can even cause up to death.)   At present, the patient is not on any immuno  modulator being pregnant.   I  talked to the patient briefly about ways to deal with the blindness, but we will talk more about assistive devices as well as visually impaired correction devices as an outpatient. The patient is also following with Dr. Oren Bracket (local ophthalmologist).    I had discussed the case with Dr. Logan Bores as well as Dr. Juliene Pina before the patient got admitted, and I dearly appreciate their help managing this patient as an inpatient. The patient was concerned about abdominal pain that she had in the past after IV Solu-Medrol infusion, and I will help her in the future if it develops.  Feel free to contact me with any further questions.  I will follow this patient with you in the hospital.   ____________________________ Hemang K. Sherryll Burger,  MD hks:cb D: 09/17/2012 20:40:57 ET T: 09/17/2012 23:18:47 ET JOB#: [id number]  cc: Hemang K. Sherryll Burger, MD, <Dictator> Durene Cal [facility] MD ELECTRONICALLY SIGNED 09/18/2012 16:46

## 2014-08-19 NOTE — Op Note (Signed)
PATIENT NAME:  Jillian Wells, BUSA MR#:  [id number] DATE OF BIRTH:  Apr 28, 1991  DATE OF PROCEDURE:  11/20/2012  PREOPERATIVE DIAGNOSIS: Elective permanent sterilization.   POSTOPERATIVE DIAGNOSIS: Elective permanent sterilization.  PROCEDURE: Laparoscopic bilateral tubal cautery.   ANESTHESIA: General endotracheal anesthesia.  SURGEON: Suzy Bouchard, MD  INDICATIONS: This is 24 year old, gravida 2, para 2 patient, status post uncomplicated delivery 6 weeks ago The patient has elected for permanent sterilization. The patient is aware of the failure rate of 1 per 200 per year.   PROCEDURE: After adequate general endotracheal anesthesia, the patient was placed in the dorsal supine position. The abdomen, perineum, and vagina were prepped with Betadine. Straight catheter was placed in the bladder and yielded 50 mL concentrated urine. The patient was draped in normal sterile fashion. A sponge stick was placed into the vagina to be used for uterine manipulation during the procedure. A 12 mm infraumbilical incision was made after injecting with 0.5% Marcaine. The laparoscope was advanced into the abdominal cavity under direct visualization with the Optiview cannula. Abdomen was insufflated. The patient was placed in Trendelenburg, and the Falope ring trocar was then advanced 2 fingerbreadths above the symphysis pubis in the midline. The right fallopian tube was identified, fimbriated end was visualized and the Falope ring was applied at the isthmic portion of the fallopian tube. Unfortunately, suboptimal placement of the fallopian tube and transection of the fallopian tube was noted; therefore, Kleppingers were brought up and right fallopian tube was cauterized in 3 separate areas with good effect. A similar procedure with the Kleppinger was performed on the patient's left fallopian tube after visualizing the fimbriated end. There were no complications. The upper abdomen appeared normal. The patient's  abdomen was deflated and the infraumbilical incision was closed with two layers with a deeper 2-0 Vicryl suture and interrupted 4-0 Vicryl suture at the skin level and the suprapubic incision was closed with a single 4-0 Vicryl suture. Dermabond placed over the incisions and a Band-Aid placed. Sponge stick was removed. There were no complications. Estimated blood loss minimal. Intraoperative fluids 500 mL. The patient tolerated the procedure well and was taken to the recovery room in good condition.   ____________________________ Suzy Bouchard, MD tjs:aw D: 11/20/2012 08:40:04 ET T: 11/20/2012 08:55:34 ET JOB#: [id number]  cc: Suzy Bouchard, MD, <Dictator> Suzy Bouchard MD ELECTRONICALLY SIGNED 11/23/2012 10:18

## 2014-08-19 NOTE — Consult Note (Signed)
PATIENT NAME:  Jillian Wells, Jillian Wells MR#:  [id number] DATE OF BIRTH:  27-Jul-1990  DATE OF CONSULTATION:  06/29/2012  REFERRING PHYSICIAN: Barnetta Chapel, MD  PATIENT'S OB/GYN: Hemang K. Sherryll Burger, MD  REASON FOR CONSULTATION: Right upper quadrant pain.   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 She did not have any spinal cord lesion or any brainstem lesion that we know of.   The patient was started on steroid, Solu-Medrol 250 twice a day for 3 days, followed by a prolonged steroid dose taper.   On Wednesday, 06/24/2012, the patient felt like she had a pins-and-needles sensation in her right upper quadrant with some pain and some dysesthesia.   She felt like the pain got worse with deep palpation. She did not have any vesicles in that region. She has not had a bowel movement in the last 6 days. She also has some nausea.   The patient also has a history of cholecystectomy.   She does not have any new weakness, numbness or any other focal neurological deficit.   The patient was evaluated by OB/GYN and GI. The patient had an ultrasound of her liver done, which showed some fatty liver, but she does not have elevated liver enzymes.   There is a concern whether the steroid is the cause of her symptoms or is there any neurological explanation for her right upper quadrant abdominal pain.   PAST MEDICAL HISTORY: Significant for asthma, recent diagnosis of multiple sclerosis, GERD, history of migraine headaches, hypertension, insomnia, depression, cholecystectomy, tonsillectomy. She is [redacted] weeks pregnant.   REVIEW OF SYSTEMS: Positive for feeling of  nauseated, right abdominal pain.  The rest of the 10 systems review of system was asked and was found to be negative.   MEDICATIONS: I reviewed her home medication list.   ALLERGIES: She does not have any known drug allergies.   PHYSICAL EXAMINATION: VITAL SIGNS: Her temperature is 98.1, pulse 84, respiratory rate 18, blood pressure 119/70, pulse ox 98%.  GENERAL: She is a young [place] female, morbidly obese, lying in bed surrounded by family members. Her mother is next to her. She is alert, oriented. She does not feel like talking much after receiving pain medication recently.  NEUROLOGIC: Detailed neurocognitive exam is not done, but mother feels like she is acting herself. She is oriented. She follows 2-step commands. She denies any hallucinations or delusions. Her mood is okay, except she is not happy due to pain. On her cranial nerves, her pupils are equal, round and reactive. Extraocular movements are intact. Her face is symmetric. Tongue is midline. Facial sensations are intact. Her hearing seems to be intact. I do not see any significant changes, except she might have a very minimal left afferent pupillary defect. On her motor exam, she has a normal tone and strength. It is hard to check her lower extremity strength, her being pregnant and giving poor effort. Her reflexes are symmetric. Her sensations are intact to light touch. I did not check her gait.  ABDOMEN: On her abdominal pain, she has intense tenderness to touch in her right upper quadrant region.   ASSESSMENT AND PLAN:  1. Right upper quadrant abdominal pain,  with some pins-and-needles sensation, which  coincidentally started on the second day of high-dose steroids. I think it is coincidental and not cause and effect, but I would still like to go down on the dose of steroids as a fast taper rather than slow taper. She already received prednisone 100 mg this morning. Tomorrow she can get 60, the day after she can get 40, the  day after she can get 20, and then we can stop.   Initially, the patient was planned to have a prolonged steroid dose taper.   Mother was quite concerned due to her not getting any MS medication, but none of the disease-modifying agents are safe during pregnancy.   I also answered the mother's questions of having an MRI, which I will hold off for now as patient is pregnant and I do not think it is necessary to repeat this scan to see whether the steroid has helped or not.   I again explained to them the possibility of increasing exacerbation after she delivers the baby, and we should consider aggressive therapies such as with Tysabri, but for now will stay in holding pattern until she delivers.  2. In terms of her right upper quadrant pain, I have very low suspicion that this is neurological in origin. She does not have any shingles vesicle. There is very little chance she might have a cord lesion causing this symptom, but she does not have any other long tract signs, so I think it is very unlikely.   3. FMLA paperwork: Mother asked me if I am willing to do her FMLA paperwork for her MS, which I can do as an outpatient.   I will see them back in followup in the clinic. Feel free to contact me with any further questions.      ____________________________ Durene Cal. Sherryll Burger, MD hks:dm D: 06/29/2012 14:05:00 ET T: 06/29/2012 15:10:21 ET JOB#: [id number]  cc: Suzy Bouchard, MD Christena Deem, MD Hemang K. Sherryll Burger, MD, <Dictator>  Durene Cal [facility] MD ELECTRONICALLY SIGNED 07/10/2012 10:14

## 2014-09-06 NOTE — H&P (Signed)
L&D Evaluation:  History:  HPI 24 yo G2qp1 at 22 weeks c/o RUQ pain now 2 days. Seen at [facility] yesterday and patient states pain has worsened. Now 8+/10. No N/V /Diarrhea/ fever. Food worsened pain today with 2 small meals.   Patient's Medical History Asthma  recent dx of MS on prednisone   Patient's Surgical History none   Medications Pre Natal Vitamins   Allergies NKDA   Social History none   Family History Non-Contributory   ROS:  ROS All systems were reviewed.  HEENT, CNS, GI, GU, Respiratory, CV, Renal and Musculoskeletal systems were found to be normal., except optic changes c/w MS  according to neurology   Exam:  Vital Signs stable   General no apparent distress   Mental Status clear   Chest clear   Heart normal sinus rhythm   Abdomen +RUQ TTP and + murphy's sign   FHT normal rate with no decels, for 22 weeks   Impression:  Impression RUQ TTP concerning for gallbladder disease   Plan:  Plan D5LR at 150 cc/ hr, amylase lipase metc and gallbladder u/s . NPO . Stadol 1-2 mg q 3-4 hr prn pain Observation status for now   Electronic Signatures: Maude Hettich, Ihor Austin (MD)  (Signed 01-Mar-14 23:42)  Authored: L&D Evaluation   Last Updated: 01-Mar-14 23:42 by Suzy Bouchard (MD)

## 2014-09-06 NOTE — H&P (Signed)
L&D Evaluation:  History:  HPI 24yo G2P1001 with LMP of 01/23/12 & EDD of 10/29/12 [facility] with UC's becoming more uncomfortable today. PNC significant for new onset of Multiple Sclerosis tx with prednisone.No ROM,VB or decreased FM.   Patient's Medical History Obesity, morphine sulfate, Late PNC at 21 weeks, Fatty liver and RUQ pain,   Patient's Surgical History GB surgery   Medications Pre Natal Vitamins   Allergies NKDA   Social History none   Family History Non-Contributory   ROS:  ROS All systems were reviewed.  HEENT, CNS, GI, GU, Respiratory, CV, Renal and Musculoskeletal systems were found to be normal.   Exam:  General no apparent distress   Mental Status clear   Chest clear   Heart normal sinus rhythm, no murmur/gallop/rubs   Abdomen gravid, non-tender   Estimated Fetal Weight Average for gestational age   Back no CVAT   Reflexes 1+   Pelvic no external lesions, cervix closed and thick   Mebranes Intact   FHT normal rate with no decels   Ucx irregular   Skin dry   Lymph no lymphadenopathy   Impression:  Impression iUP at 24 weeks with B-H's   Plan:  Plan Urine neg for infection, FFN neg   Comments FU Wed with Dr Logan Bores. Modified Bedrest. no Work till further notice.   Electronic Signatures: Sharee Pimple (CNM)  (Signed 16-Mar-14 12:47)  Authored: L&D Evaluation   Last Updated: 16-Mar-14 12:47 by Sharee Pimple (CNM)

## 2015-02-12 IMAGING — US US RENAL KIDNEY
1 series · 14 of 25 positions shown · non-contrast
Comparison: none

REASON FOR EXAM: right costovertebral angle pain
COMMENTS:

[Series 1: us renal kidney · 0.30mm/px · 14 of 48 slices shown]
[im 1/48]
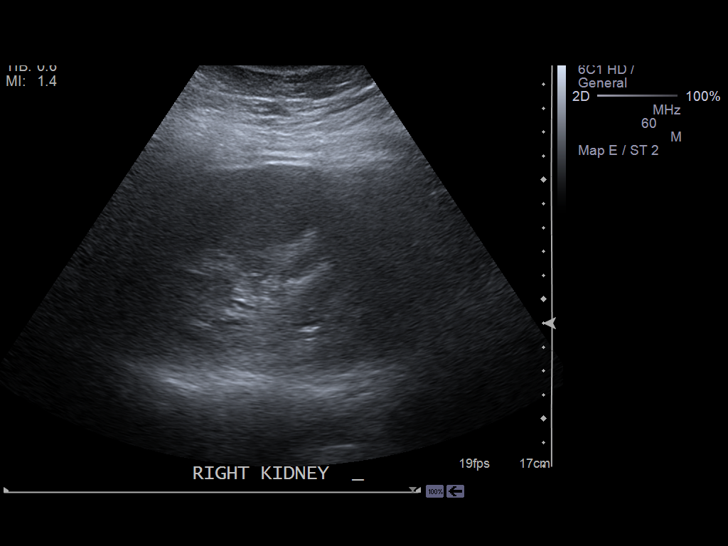
[im 4/48]
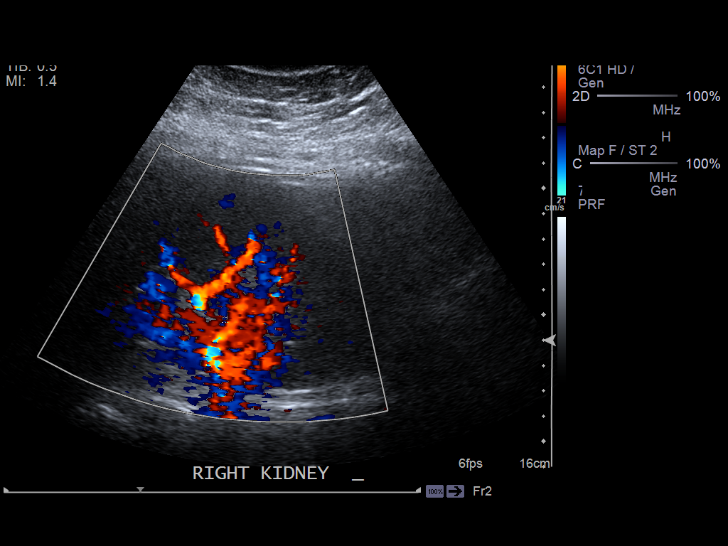
[im 8/48]
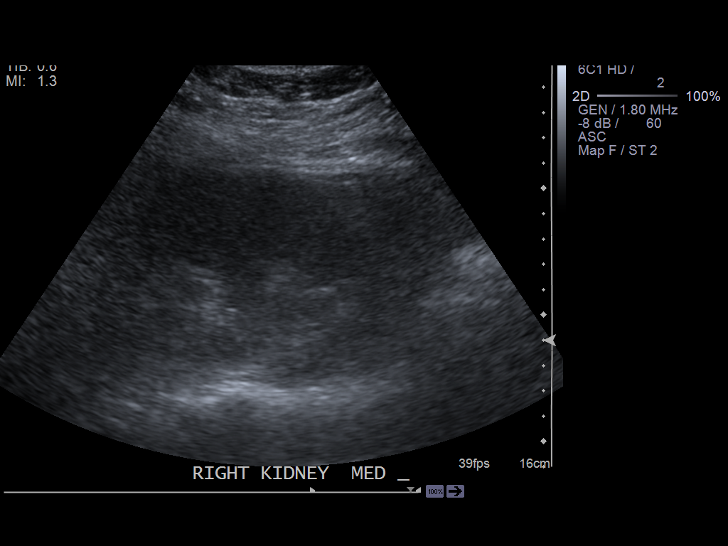
[im 12/48]
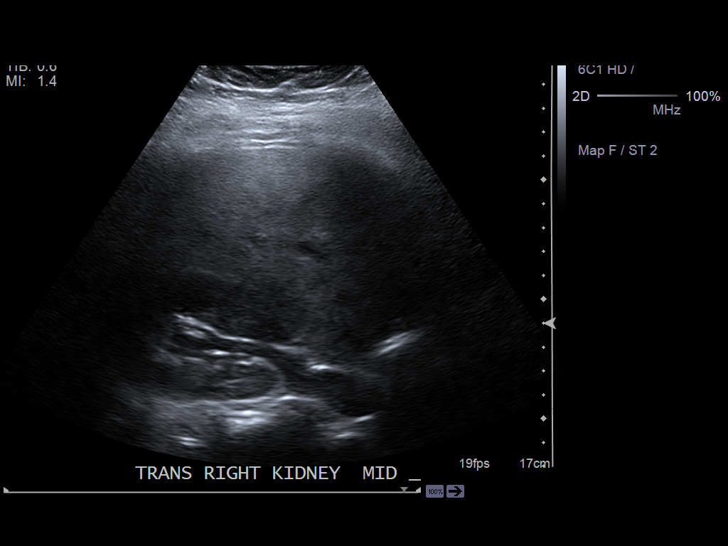
[im 16/48]
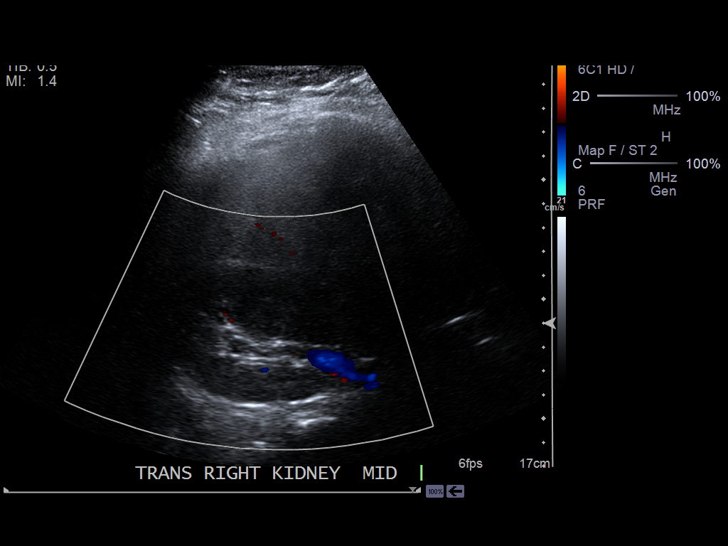
[im 18/48]
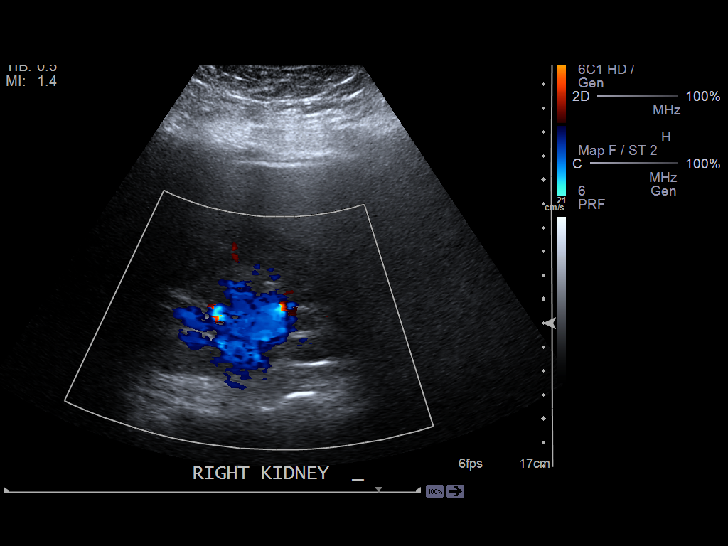
[im 22/48]
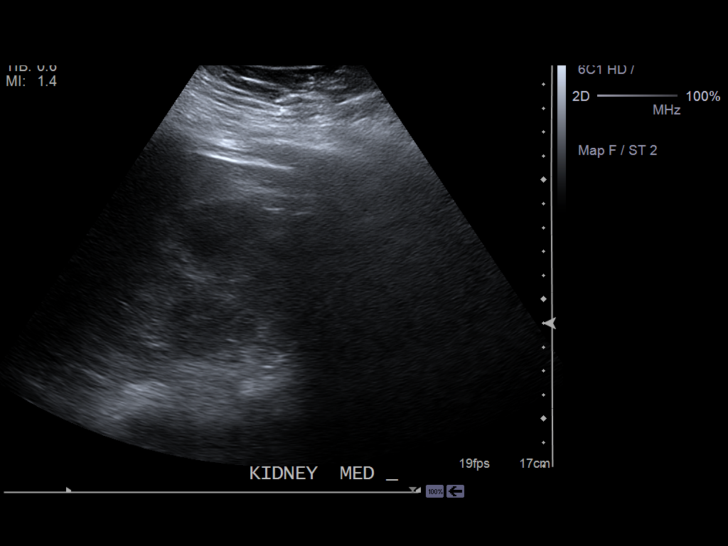
[im 26/48]
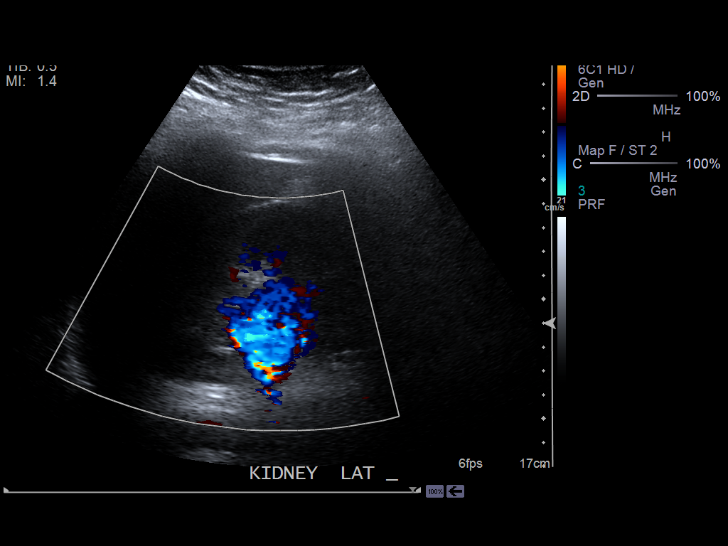
[im 30/48]
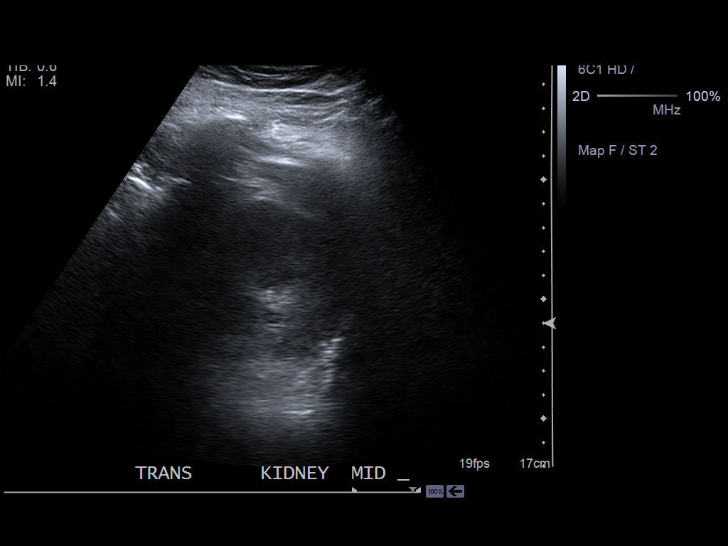
[im 32/48]
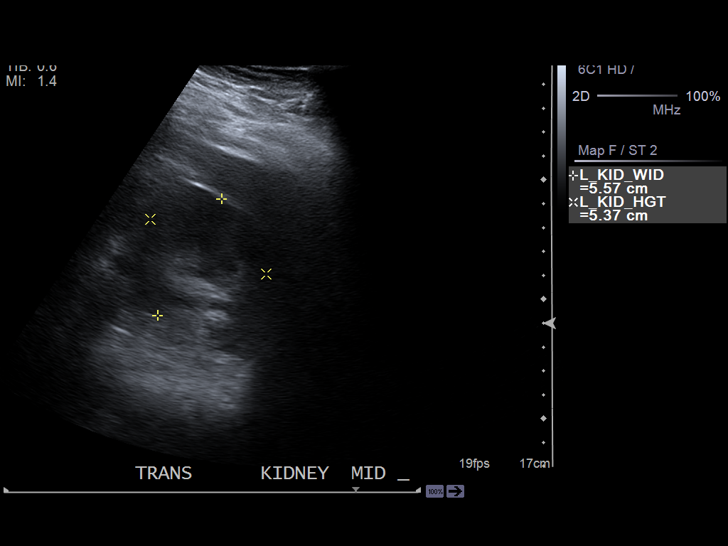
[im 36/48]
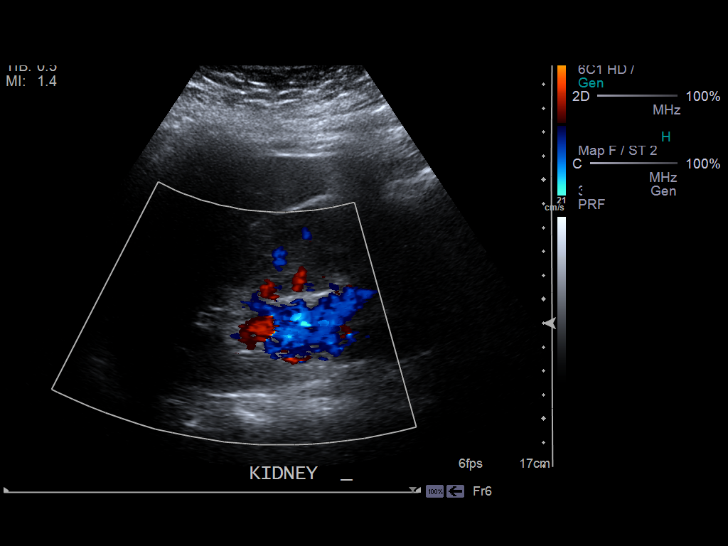
[im 40/48]
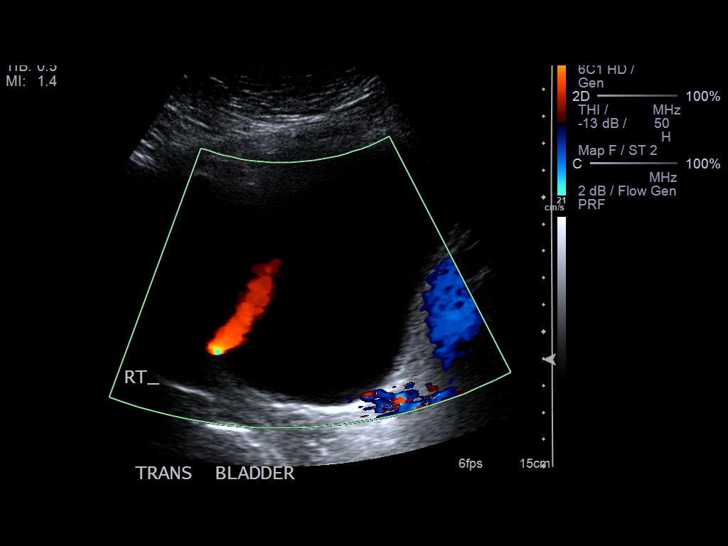
[im 44/48]
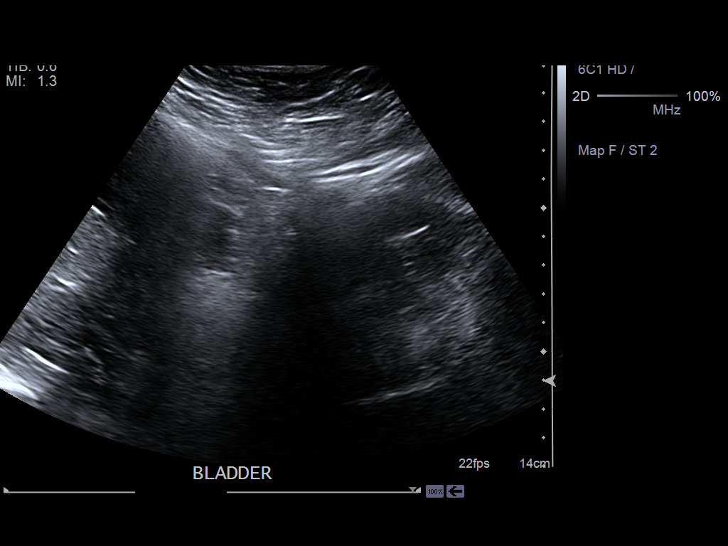
[im 48/48]
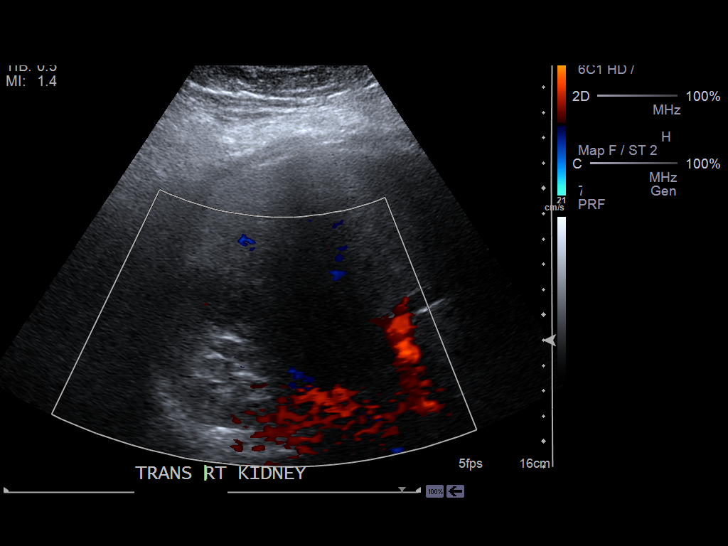

[14 of 25 positions shown; findings below may reference images not displayed]

PROCEDURE:     US  - US KIDNEY  - June 28, 2012  [DATE]

RESULT:     Comparison: None

Technique and findings: Multiple gray-scale and color doppler images of the
kidneys were obtained.

The right kidney measures 13.3 x 6.9 x 5.2 cm and the left kidney measures
13.6 x 5.6 x 5.4 cm. The kidneys are normal in echogenicity. There is no
left hydronephrosis. There is mild right pelvic caliectasis.  There are no
echogenic foci.  There are no renal masses. There is no free fluid in the
region of the renal fossa. Bilateral ureteral jets are noted.
IMPRESSION: Mild right pelvicaliectasis.

[REDACTED]

## 2015-02-12 IMAGING — US ABDOMEN ULTRASOUND LIMITED
1 series · 14 of 20 positions shown · non-contrast
Comparison: none

REASON FOR EXAM: abdominal pain
COMMENTS:   Body Site: GB and Fossa, CBD, Head of Pancreas

PROCEDURE:     US  - US ABDOMEN LIMITED SURVEY  - June 28, 2012  [DATE]
RESULT:     Comparison: None
TECHNIQUE: Multiple gray-scale and color-flow Doppler images of the right
upper quadrant are presented for review.

[Series 1: abdomen ultrasound limited · 0.30mm/px · 14 of 20 slices shown]
[im 1/20]
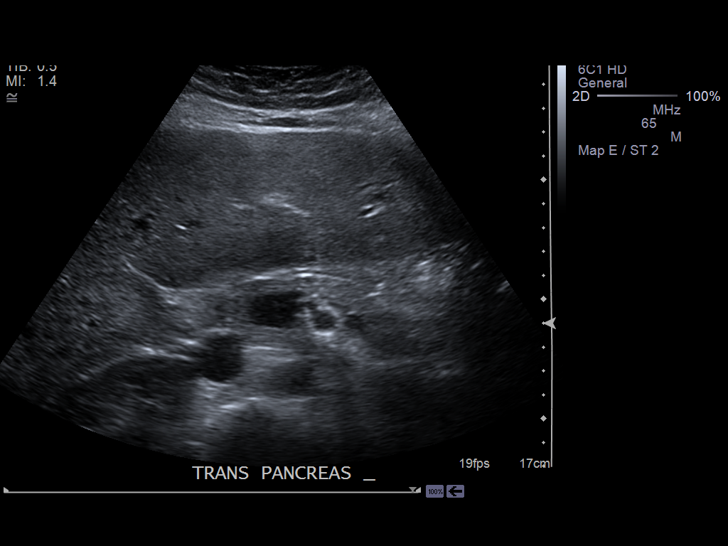
[im 3/20]
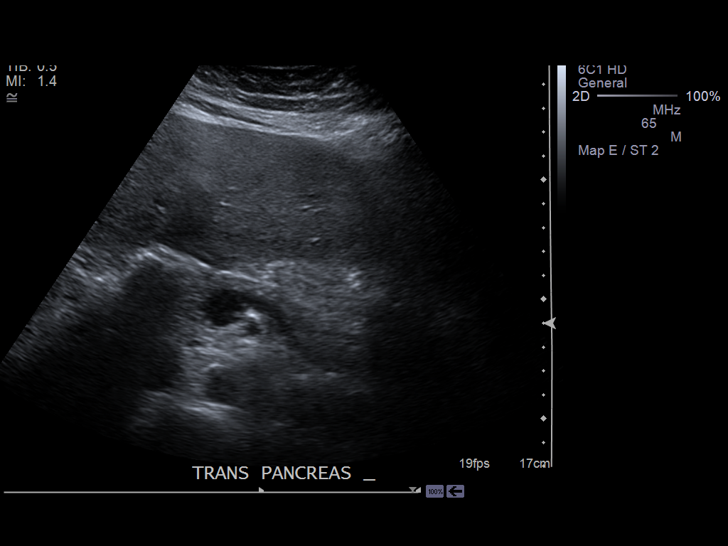
[im 4/20]
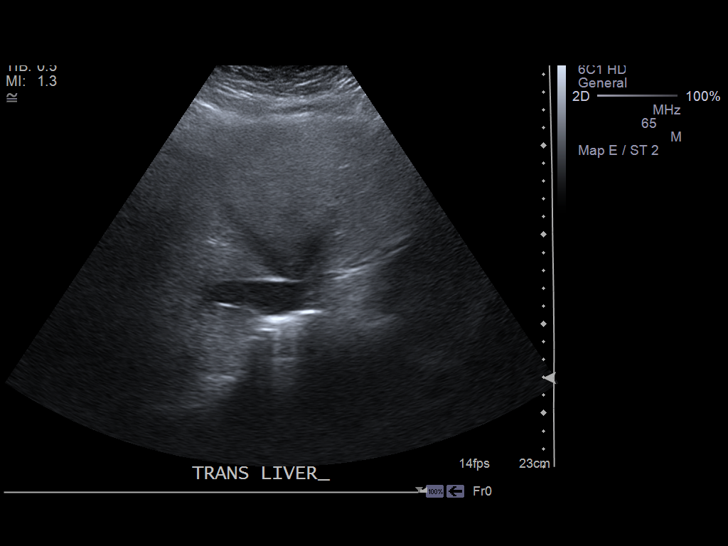
[im 6/20]
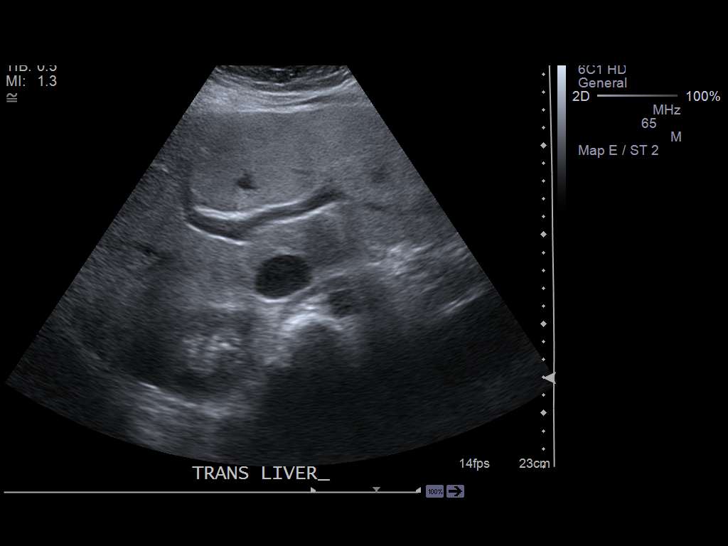
[im 7/20]
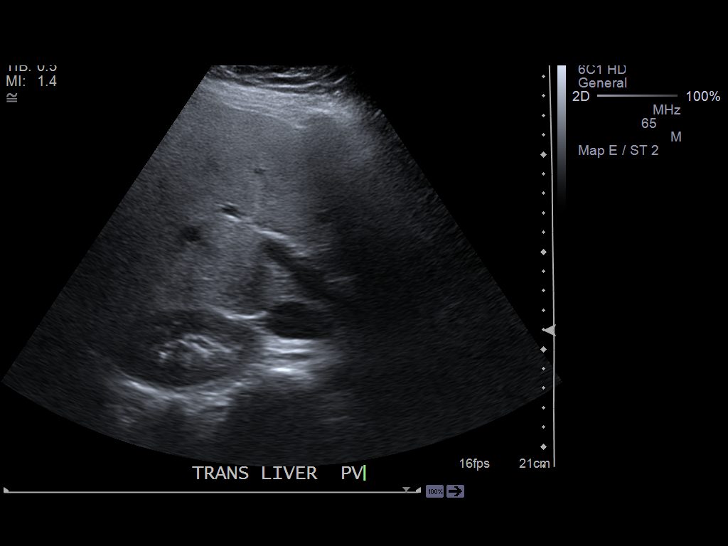
[im 8/20]
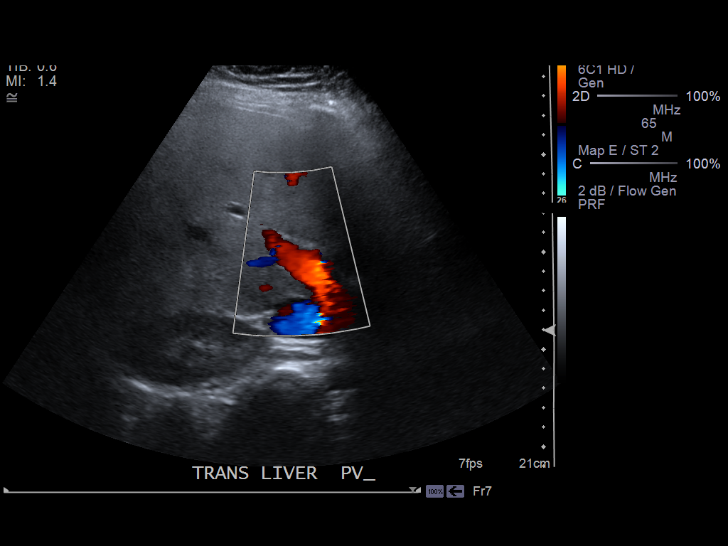
[im 10/20]
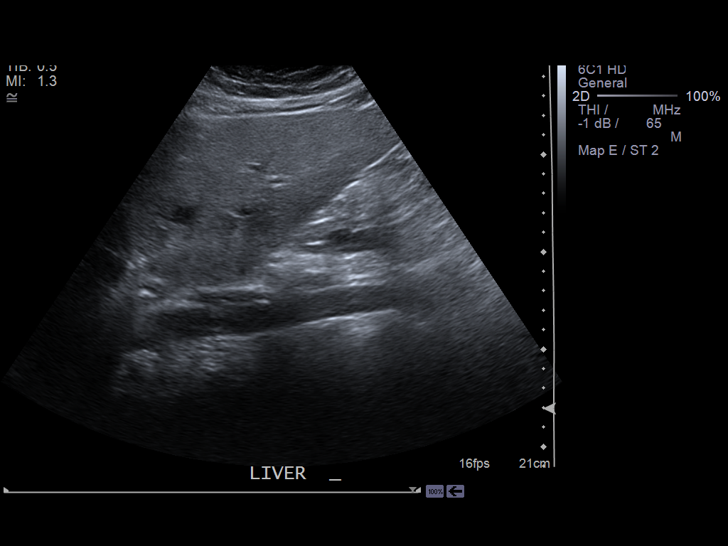
[im 11/20]
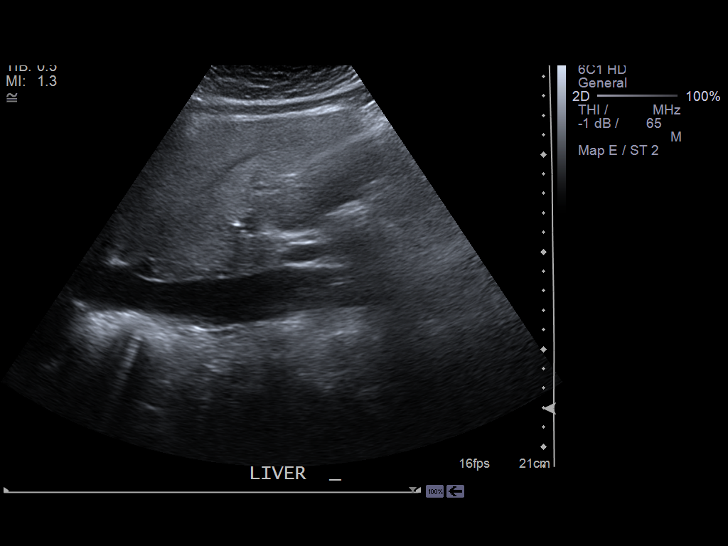
[im 13/20]
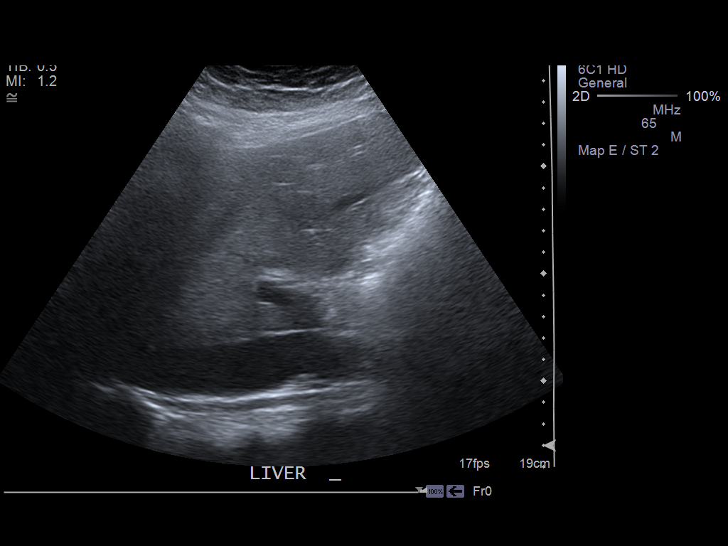
[im 14/20]
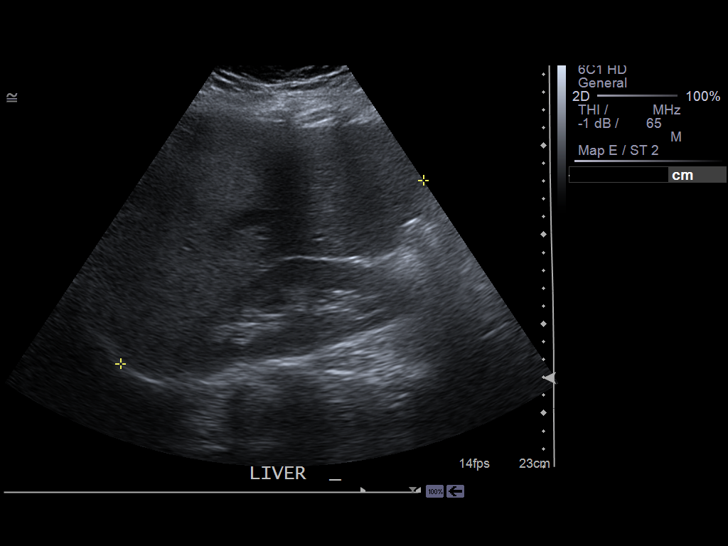
[im 16/20]
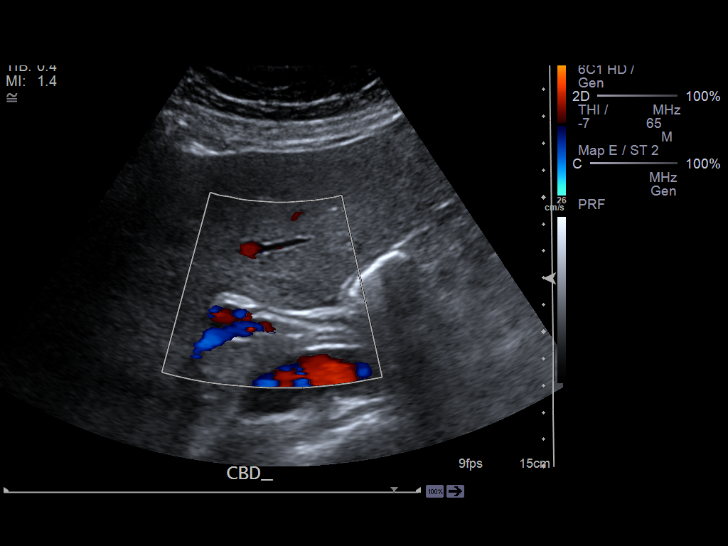
[im 17/20]
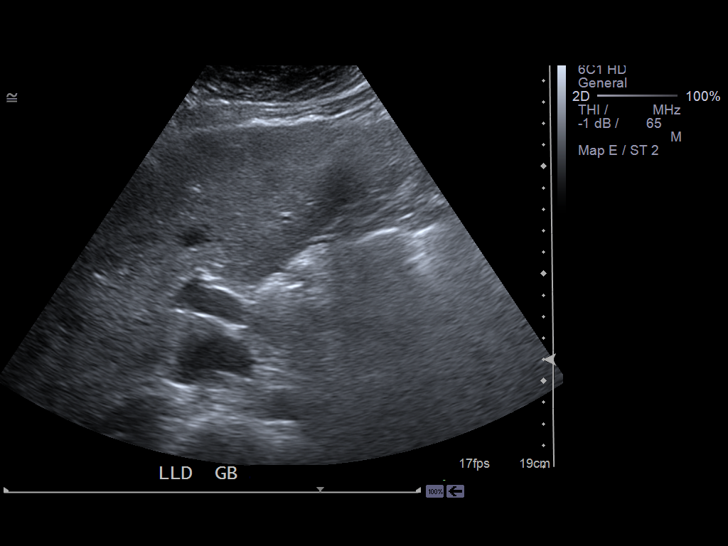
[im 18/20]
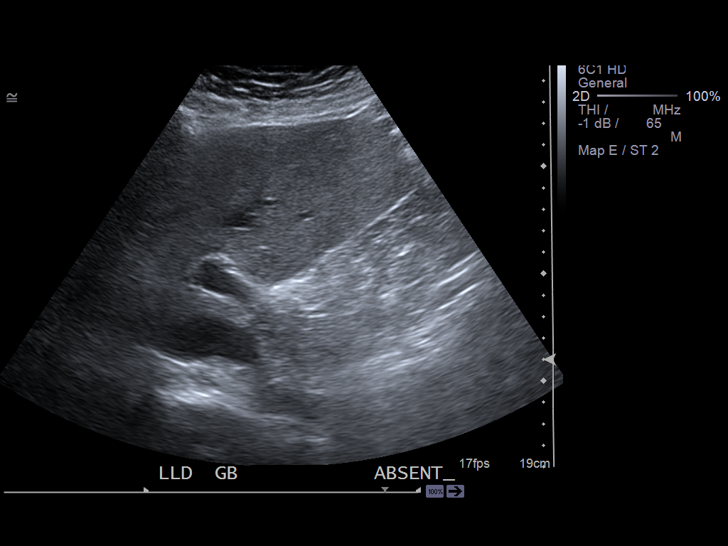
[im 20/20]
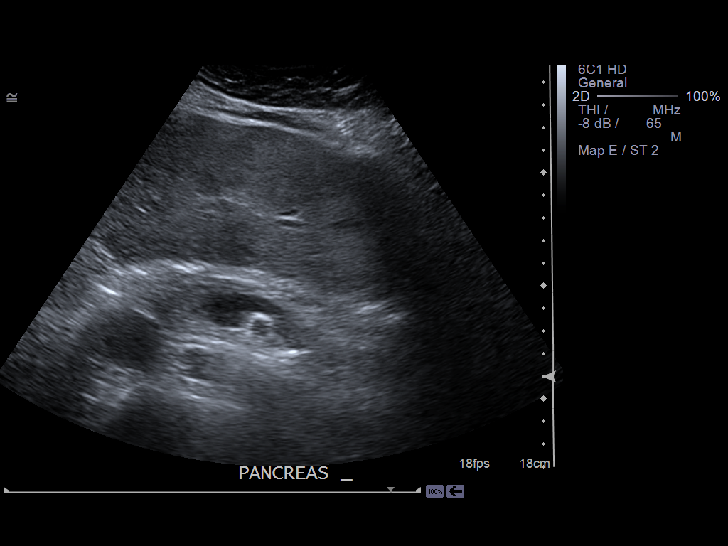

[14 of 20 positions shown; findings below may reference images not displayed]

FINDINGS: The liver is increased in echogenicity. The liver is enlarged measuring
cm in length. The liver is without evidence of a focal hepatic lesion.

Prior cholecystectomy. There is no intra- or extrahepatic biliary ductal
dilatation. The common duct measures 3.1 mm in maximal diameter.

The visualized portion of the pancreas is normal in echogenicity.
IMPRESSION: Prior cholecystectomy.

Hepatomegaly and hepatic steatosis.

[REDACTED]

## 2016-03-26 ENCOUNTER — Emergency Department: Payer: 59

## 2016-03-26 ENCOUNTER — Encounter: Payer: Self-pay | Admitting: Emergency Medicine

## 2016-03-26 ENCOUNTER — Emergency Department
Admission: EM | Admit: 2016-03-26 | Discharge: 2016-03-26 | Disposition: A | Discharge status: Home / Self Care | Payer: 59 | Attending: Emergency Medicine | Admitting: Emergency Medicine

## 2016-03-26 DIAGNOSIS — J45901 Unspecified asthma with (acute) exacerbation: Secondary | ICD-10-CM | POA: Insufficient documentation

## 2016-03-26 DIAGNOSIS — J069 Acute upper respiratory infection, unspecified: Secondary | ICD-10-CM | POA: Insufficient documentation

## 2016-03-26 DIAGNOSIS — F17210 Nicotine dependence, cigarettes, uncomplicated: Secondary | ICD-10-CM | POA: Insufficient documentation

## 2016-03-26 DIAGNOSIS — R0789 Other chest pain: Secondary | ICD-10-CM | POA: Diagnosis present

## 2016-03-26 HISTORY — DX: Multiple sclerosis: G35

## 2016-03-26 LAB — BASIC METABOLIC PANEL
ANION GAP: 6 (ref 5–15)
BUN: 11 mg/dL (ref 6–20)
CALCIUM: 9.2 mg/dL (ref 8.9–10.3)
CO2: 23 mmol/L (ref 22–32)
Chloride: 108 mmol/L (ref 101–111)
Creatinine, Ser: 0.8 mg/dL (ref 0.44–1.00)
GFR calc Af Amer: >60 mL/min (ref >60)
GFR calc non Af Amer: >60 mL/min (ref >60)
GLUCOSE: 86 mg/dL (ref 65–99)
POTASSIUM: 3.7 mmol/L (ref 3.5–5.1)
Sodium: 137 mmol/L (ref 135–145)

## 2016-03-26 LAB — TROPONIN I

## 2016-03-26 LAB — CBC
HEMATOCRIT: 36.8 % (ref 35.0–47.0)
HEMOGLOBIN: 12.5 g/dL (ref 12.0–16.0)
MCH: 28.3 pg (ref 26.0–34.0)
MCHC: 34 g/dL (ref 32.0–36.0)
MCV: 83.2 fL (ref 80.0–100.0)
Platelets: 250 10*3/uL (ref 150–440)
RBC: 4.42 MIL/uL (ref 3.80–5.20)
RDW: 14.9 % — ABNORMAL HIGH (ref 11.5–14.5)
WBC: 6.3 10*3/uL (ref 3.6–11.0)

## 2016-03-26 MED ORDER — ALBUTEROL SULFATE (2.5 MG/3ML) 0.083% IN NEBU
2.5000 mg | INHALATION_SOLUTION | Freq: Once | RESPIRATORY_TRACT | Status: AC
Start: 2016-03-26 — End: 2016-03-26
  Administered 2016-03-26: 2.5 mg via RESPIRATORY_TRACT

## 2016-03-26 MED ORDER — ALBUTEROL SULFATE (2.5 MG/3ML) 0.083% IN NEBU
INHALATION_SOLUTION | RESPIRATORY_TRACT | Status: AC
  Filled 2016-03-26: qty 3

## 2016-03-26 NOTE — ED Triage Notes (Signed)
Patient presents to the ED with chest pain and shortness of breath that began around noon.  Patient reports pain is to the center of her chest and radiates into her back.  Patient states, "it feels like somebody is stabbing me."  Patient reports numbness and tingling in fingers and toes.  Patient denies anxiety.  Patient is in no obvious distress at this time.  Respirations are even and not labored.  Skin is warm and dry.

## 2016-03-26 NOTE — ED Provider Notes (Signed)
[facility] Emergency Department Provider Note  ____________________________________________   First MD Initiated Contact with Patient 03/26/16 1816     (approximate)  I have reviewed the triage vital signs and the nursing notes.   HISTORY  Chief Complaint Chest Pain   HPI Jillian Wells is a 25 y.o. female with a history of multiple sclerosis who is presenting to the emergency department with chest pain that started 5-1/2 hours ago. She says that she was resting at the time that the pain is a 7 out of 10 right now and feels like a tightness that increases with breathing. She says that she has also had a cough today and had exposure to her daughter who currently has a URI. The patient says that she also has tingling in her upper and lower extremities. Says the pain also feels like it goes through to the back. Denies being on any hormone supplementation. Says that the pain also worsens with movement but denies any injury or heavy lifting.   Past Medical History:  Diagnosis Date  . Multiple sclerosis (HCC)    diagnosed in 2014    There are no active problems to display for this patient.   Past Surgical History:  Procedure Laterality Date  . CHOLECYSTECTOMY    . TONSILLECTOMY      Prior to Admission medications   Not on File    Allergies Patient has no known allergies.  No family history on file.  Social History Social History  Substance Use Topics  . Smoking status: Current Every Day Smoker    Packs/day: 0.25    Types: Cigarettes  . Smokeless tobacco: Never Used  . Alcohol use No    Review of Systems Constitutional: No fever/chills Eyes: No visual changes. ENT: No sore throat. Cardiovascular: As above Respiratory: Denies shortness of breath. Gastrointestinal: No abdominal pain.  No nausea, no vomiting.   Genitourinary: Negative for dysuria. Musculoskeletal: Negative for back pain. Skin: Negative for rash. Neurological:  Negative for headaches, focal weakness or numbness.  10-point ROS otherwise negative.  ____________________________________________   PHYSICAL EXAM:  VITAL SIGNS: ED Triage Vitals  Enc Vitals Group     BP 03/26/16 1618 (!) 141/83     Pulse Rate 03/26/16 1618 83     Resp 03/26/16 1618 18     Temp 03/26/16 1618 98.4 F (36.9 C)     Temp Source 03/26/16 1618 Oral     SpO2 03/26/16 1618 99 %     Weight 03/26/16 1619 244 lb (110.7 kg)     Height 03/26/16 1619 5\' 7"  (1.702 m)     Head Circumference --      Peak Flow --      Pain Score 03/26/16 1619 10     Pain Loc --      Pain Edu? --      Excl. in GC? --     Constitutional: Alert and oriented. Well appearing and in no acute distress. Eyes: Conjunctivae are normal. PERRL. EOMI. Head: Atraumatic. Nose: No congestion/rhinnorhea. Mouth/Throat: Mucous membranes are moist.   Neck: No stridor.   Cardiovascular: Normal rate, regular rhythm. Grossly normal heart sounds.  Bilateral, equal and intact radial as well as dorsalis pedis pulses. Chest pain is reproducible to palpation anteriorly as well as the bilateral trapezius muscles and rhomboid groups. Respiratory: Normal respiratory effort.  No retractions. Scant wheezes throughout. Gastrointestinal: Soft and nontender. No distention.  Musculoskeletal: No lower extremity tenderness nor edema.  No joint effusions.  Neurologic:  Normal speech and language. No gross focal neurologic deficits are appreciated. No gait instability. Skin:  Skin is warm, dry and intact. No rash noted. Psychiatric: Mood and affect are normal. Speech and behavior are normal.  ____________________________________________   LABS (all labs ordered are listed, but only abnormal results are displayed)  Labs Reviewed  CBC - Abnormal; Notable for the following:       Result Value   RDW 14.9 (*)    All other components within normal limits  BASIC METABOLIC PANEL  TROPONIN I    ____________________________________________  EKG  ED ECG REPORT I, Taneeka Curtner,  Teena Iraniavid M, the attending physician, personally viewed and interpreted this ECG.   Date: 03/26/2016  EKG Time: 1615  Rate: 86  Rhythm: normal sinus rhythm  Axis: Normal  Intervals:none  ST&T Change: No ST segment elevation or depression. No abnormal T-wave inversion.  ____________________________________________  RADIOLOGY DG Chest 2 View (Final result)  Result time 03/26/16 16:51:36  Final result by Lacy DuverneySteven Olson, MD (03/26/16 16:51:36)           Narrative:   CLINICAL DATA: 25 year old female with shortness of breath. Pain center of chest extending to back. History of multiple sclerosis. Initial encounter.  EXAM: CHEST 2 VIEW  COMPARISON: 06/07/2013 and 07/29/2010.  FINDINGS: No infiltrate, congestive heart failure or pneumothorax.  Minimal peribronchial thickening stable and probably normal for this patient.  Minimal curvature thoracic spine.  Heart size within normal limits.  IMPRESSION: No acute pulmonary abnormality noted.   Electronically Signed By: Lacy DuverneySteven Olson M.D. On: 03/26/2016 16:51           ____________________________________________   PROCEDURES  Procedure(s) performed:   Procedures  Critical Care performed:   ____________________________________________   INITIAL IMPRESSION / ASSESSMENT AND PLAN / ED COURSE  Pertinent labs & imaging results that were available during my care of the patient were reviewed by me and considered in my medical decision making (see chart for details).  PERC negative.   Clinical Course    ----------------------------------------- 7:34 PM on 03/26/2016 -----------------------------------------  Patient without any significant history of cardiac disease in young people in her family. No longer wheezing after breathing treatment. Says that her pain is been decreased. Most likely chest wall pain secondary to  muscle aches from URI and bronchospasm. The patient says that she has an inhaler at home. Very unlikely to be pulmonary embolus. Paresthesia in the bilateral upper and lower extremities but with equal pulses throughout and normal mediastinum on the chest x-ray. Explained the plan as well as diagnosis patient and her family and they're understanding what to comply. Patient will be discharged home. Furthermore, the patient says that she has a history of asthma. Also without any sensory deficits to light touch throughout.  ____________________________________________   FINAL CLINICAL IMPRESSION(S) / ED DIAGNOSES  Chest wall pain. Asthma exacerbation. URI.    NEW MEDICATIONS STARTED DURING THIS VISIT:  New Prescriptions   No medications on file     Note:  This document was prepared using Dragon voice recognition software and may include unintentional dictation errors.    Myrna Blazeravid Matthew Angeligue Bowne, MD 03/26/16 [phone]

## 2018-01-21 ENCOUNTER — Encounter: Payer: Self-pay | Admitting: Emergency Medicine

## 2018-01-21 ENCOUNTER — Emergency Department
Admission: EM | Admit: 2018-01-21 | Discharge: 2018-01-21 | Disposition: A | Discharge status: Home / Self Care | Payer: No Typology Code available for payment source | Attending: Emergency Medicine | Admitting: Emergency Medicine

## 2018-01-21 ENCOUNTER — Emergency Department: Payer: No Typology Code available for payment source

## 2018-01-21 DIAGNOSIS — M542 Cervicalgia: Secondary | ICD-10-CM | POA: Insufficient documentation

## 2018-01-21 DIAGNOSIS — M545 Low back pain: Secondary | ICD-10-CM | POA: Insufficient documentation

## 2018-01-21 DIAGNOSIS — G35 Multiple sclerosis: Secondary | ICD-10-CM | POA: Insufficient documentation

## 2018-01-21 DIAGNOSIS — Z9049 Acquired absence of other specified parts of digestive tract: Secondary | ICD-10-CM | POA: Diagnosis not present

## 2018-01-21 DIAGNOSIS — F17210 Nicotine dependence, cigarettes, uncomplicated: Secondary | ICD-10-CM | POA: Diagnosis not present

## 2018-01-21 DIAGNOSIS — Z79899 Other long term (current) drug therapy: Secondary | ICD-10-CM | POA: Insufficient documentation

## 2018-01-21 MED ORDER — CYCLOBENZAPRINE HCL 5 MG PO TABS: ORAL_TABLET | ORAL | 0 refills | Status: AC

## 2018-01-21 MED ORDER — IBUPROFEN 600 MG PO TABS: 600.0000 mg | ORAL_TABLET | Freq: Four times a day (QID) | ORAL | 0 refills | Status: AC | PRN
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
## 2020-01-22 ENCOUNTER — Other Ambulatory Visit: Payer: Self-pay

## 2020-01-22 ENCOUNTER — Ambulatory Visit (HOSPITAL_COMMUNITY)
Admission: EM | Admit: 2020-01-22 | Discharge: 2020-01-22 | Disposition: A | Discharge status: Home / Self Care | Payer: Medicaid Other | Attending: Family Medicine | Admitting: Family Medicine

## 2020-01-22 ENCOUNTER — Encounter (HOSPITAL_COMMUNITY): Payer: Self-pay | Admitting: *Deleted

## 2020-01-22 DIAGNOSIS — K047 Periapical abscess without sinus: Secondary | ICD-10-CM

## 2020-01-22 DIAGNOSIS — Z3009 Encounter for other general counseling and advice on contraception: Secondary | ICD-10-CM

## 2020-01-22 HISTORY — DX: Other chronic pain: G89.29

## 2020-01-22 MED ORDER — AMOXICILLIN-POT CLAVULANATE 875-125 MG PO TABS: 1.0000 | ORAL_TABLET | Freq: Two times a day (BID) | ORAL | 0 refills | Status: AC

## 2020-01-22 MED ORDER — LIDOCAINE VISCOUS HCL 2 % MT SOLN: 15.0000 mL | OROMUCOSAL | 0 refills | Status: AC | PRN

## 2020-01-22 MED ORDER — NORETHINDRONE 0.35 MG PO TABS: 1.0000 | ORAL_TABLET | Freq: Every day | ORAL | 1 refills | Status: AC

## 2020-01-22 NOTE — ED Triage Notes (Signed)
C/O abscess to left lower tooth x approx 1 month; now also c/o right lower tooth abscess as well, onset few days ago.  Denies fevers.  Also states had to cancel her appt for Depo Shot, and is unable to get another appt until next month; pt requesting BCP Rx until she can get her next Depo Shot.

## 2020-01-22 NOTE — ED Provider Notes (Signed)
[facility]    CSN: [id number] Arrival date & time: 01/22/20  1703      History   Chief Complaint Chief Complaint  Patient presents with  . Dental Pain  . Needs BCP Rx    HPI Jillian Wells is a 29 y.o. female.   Here today with about a month of ongoing abscesses on lower jaw line in two different places. States she has tried multiple home remedies to get them to go away and she will get them to drain but they recur. Denies fever, chills, body aches and states they are currently a bit better than they had been a few days ago. Pain currently under fairly good control.   Also notes she typically takes depo injection to avoid having a menstrual cycle due to cramping issues (hx of tubal ligation for permanent contraception) and missed her recent appt, was told 2 months was next available appt to get next shot which is due. She is requesting oral contraceptive to bridge her until she can get her next injection.     Past Medical History:  Diagnosis Date  . Chronic back pain   . Multiple sclerosis (HCC)    diagnosed in 2014    There are no problems to display for this patient.   Past Surgical History:  Procedure Laterality Date  . CHOLECYSTECTOMY    . TONSILLECTOMY    . TUBAL LIGATION      OB History   No obstetric history on file.      Home Medications    Prior to Admission medications   Medication Sig Start Date End Date Taking? Authorizing Provider  amoxicillin-clavulanate (AUGMENTIN) 875-125 MG tablet Take 1 tablet by mouth every 12 (twelve) hours. 01/22/20   Particia Nearing, PA-C  cyclobenzaprine (FLEXERIL) 5 MG tablet Take 1-2 tablets 3 times daily as needed 01/21/18   Enid Derry, PA-C  ibuprofen (ADVIL,MOTRIN) 600 MG tablet Take 1 tablet (600 mg total) by mouth every 6 (six) hours as needed. 01/21/18   Enid Derry, PA-C  lidocaine (XYLOCAINE) 2 % solution Use as directed 15 mLs in the mouth or throat as needed for mouth pain. 01/22/20    Particia Nearing, PA-C  norethindrone (MICRONOR) 0.35 MG tablet Take 1 tablet (0.35 mg total) by mouth daily. 01/22/20   Particia Nearing, PA-C  nortriptyline (PAMELOR) 25 MG capsule Take 25 mg by mouth at bedtime.    [provider]    Family History Family History  Problem Relation Age of Onset  . Hypertension Mother   . Heart Problems Mother   . Hypercholesterolemia Mother   . Hypertension Father   . Osteoarthritis Father     Social History Social History   Tobacco Use  . Smoking status: Current Every Day Smoker    Packs/day: 0.25    Types: Cigarettes  . Smokeless tobacco: Never Used  . Tobacco comment: "trying to quit"  Vaping Use  . Vaping Use: Never used  Substance Use Topics  . Alcohol use: No  . Drug use: Never     Allergies   Patient has no known allergies.   Review of Systems Review of Systems PER HPI    Physical Exam Triage Vital Signs ED Triage Vitals  Enc Vitals Group     BP 01/22/20 1750 (!) 159/89     Pulse Rate 01/22/20 1750 (!) 112     Resp 01/22/20 1750 16     Temp 01/22/20 1750 98.6 F (37 C)  Temp Source 01/22/20 1750 Oral     SpO2 01/22/20 1750 97 %     Weight --      Height --      Head Circumference --      Peak Flow --      Pain Score 01/22/20 1800 8     Pain Loc --      Pain Edu? --      Excl. in GC? --    No data found.  Updated Vital Signs BP (!) 159/89 (BP Location: Right Arm)   Pulse (!) 112   Temp 98.6 F (37 C) (Oral)   Resp 16   LMP 01/21/2020 (Exact Date) Comment: "spotting" since missing Depo shot  SpO2 97%   Visual Acuity Right Eye Distance:   Left Eye Distance:   Bilateral Distance:    Right Eye Near:   Left Eye Near:    Bilateral Near:     Physical Exam Vitals and nursing note reviewed.  Constitutional:      Appearance: Normal appearance. She is not ill-appearing.  HENT:     Head: Atraumatic.     Right Ear: Tympanic membrane normal.     Left Ear: Tympanic membrane  normal.     Nose: Nose normal.     Mouth/Throat:     Mouth: Mucous membranes are moist.     Comments: Multiple cavities on inspection of teeth, gingival erythema and edema b/l lower gumline posterior to canine tooth. No active drainage or abscess noted  Eyes:     Extraocular Movements: Extraocular movements intact.     Conjunctiva/sclera: Conjunctivae normal.  Cardiovascular:     Rate and Rhythm: Normal rate and regular rhythm.     Heart sounds: Normal heart sounds.  Pulmonary:     Effort: Pulmonary effort is normal.     Breath sounds: Normal breath sounds.  Abdominal:     General: Bowel sounds are normal. There is no distension.     Palpations: Abdomen is soft.     Tenderness: There is no abdominal tenderness. There is no right CVA tenderness, left CVA tenderness or guarding.  Musculoskeletal:        General: Normal range of motion.     Cervical back: Normal range of motion and neck supple.  Skin:    General: Skin is warm and dry.  Neurological:     Mental Status: She is alert and oriented to person, place, and time.  Psychiatric:        Mood and Affect: Mood normal.        Thought Content: Thought content normal.        Judgment: Judgment normal.      UC Treatments / Results  Labs (all labs ordered are listed, but only abnormal results are displayed) Labs Reviewed - No data to display  EKG   Radiology No results found.  Procedures Procedures (including critical care time)  Medications Ordered in UC Medications - No data to display  Initial Impression / Assessment and Plan / UC Course  I have reviewed the triage vital signs and the nursing notes.  Pertinent labs & imaging results that were available during my care of the patient were reviewed by me and considered in my medical decision making (see chart for details).     Dental abscesses  Augmentin, viscous lidocaine sent. Discussed salt water rinses and good oral hygiene, f/u with dentist as soon as able.     Will prescribe mini pill to mimic depo  hormone regimen until she can resume her typical depo routine in 2 months. Discussed to use daily without missed days to avoid breakthrough bleeding.   F/u if sxs worsening and not improving and f/u with PCP for depo as scheduled.   Final Clinical Impressions(s) / UC Diagnoses   Final diagnoses:  Dental abscess  Encounter for other general counseling or advice on contraception   Discharge Instructions   None    ED Prescriptions    Medication Sig Dispense Auth. Provider   norethindrone (MICRONOR) 0.35 MG tablet Take 1 tablet (0.35 mg total) by mouth daily. 30 tablet Particia Nearing, [place]   amoxicillin-clavulanate (AUGMENTIN) 875-125 MG tablet Take 1 tablet by mouth every 12 (twelve) hours. 14 tablet Particia Nearing, [place]   lidocaine (XYLOCAINE) 2 % solution Use as directed 15 mLs in the mouth or throat as needed for mouth pain. 100 mL Particia Nearing, [place]     PDMP not reviewed this encounter.   Particia Nearing, [place] 01/22/20 1825

## 2021-03-13 ENCOUNTER — Other Ambulatory Visit: Payer: Self-pay

## 2021-03-13 ENCOUNTER — Emergency Department
Admission: EM | Admit: 2021-03-13 | Discharge: 2021-03-13 | Disposition: A | Discharge status: Home / Self Care | Payer: Medicaid Other | Attending: Emergency Medicine | Admitting: Emergency Medicine

## 2021-03-13 DIAGNOSIS — Z87891 Personal history of nicotine dependence: Secondary | ICD-10-CM | POA: Diagnosis not present

## 2021-03-13 DIAGNOSIS — M25562 Pain in left knee: Secondary | ICD-10-CM | POA: Diagnosis present

## 2021-03-13 DIAGNOSIS — G35 Multiple sclerosis: Secondary | ICD-10-CM | POA: Diagnosis not present

## 2021-03-13 MED ORDER — PREDNISONE 10 MG (21) PO TBPK: ORAL_TABLET | ORAL | 0 refills | Status: AC

## 2021-03-13 NOTE — ED Triage Notes (Addendum)
PT to ED via POV cc "relapse of MS" . PT states these episodes normally last about 2-3 days, she is currently on day 3. PT c/o 10/10 pain in L knee and L foot and also states weakness. States the foot pain is normal but has never had knee pain before. Unsure when last flare up was, dx approx 8 years ago. No attempt at pain medications as she states she has tried tylenol and ibu in the past and it does not help. Face symmetric, speech clear.

## 2021-03-13 NOTE — Discharge Instructions (Signed)
Take tapered prednisone as directed.  

## 2021-03-20 NOTE — ED Provider Notes (Signed)
[facility]-EMERGENCY DEPARTMENT  ____________________________________________  Time seen: Approximately 3:30 PM  I have reviewed the triage vital signs and the nursing notes.   HISTORY  Chief Complaint Knee Pain and Foot Pain   Historian Patient     HPI Jillian Wells is a 30 y.o. female presents to the emergency department for a prednisone prescription.  Patient states that she has a history of MS and she currently has left knee pain consistent with MS flares in the past.  She denies new weakness, chest pain, chest tightness or abdominal pain.   Past Medical History:  Diagnosis Date   Chronic back pain    Multiple sclerosis (HCC)    diagnosed in 2014     Immunizations up to date:  Yes.     Past Medical History:  Diagnosis Date   Chronic back pain    Multiple sclerosis (HCC)    diagnosed in 2014    There are no problems to display for this patient.   Past Surgical History:  Procedure Laterality Date   CHOLECYSTECTOMY     TONSILLECTOMY     TUBAL LIGATION      Prior to Admission medications   Medication Sig Start Date End Date Taking? Authorizing Provider  predniSONE (STERAPRED UNI-PAK 21 TAB) 10 MG (21) TBPK tablet 6,5,4,3,2,1 03/13/21  Yes Pia Mau M, PA-C  amoxicillin-clavulanate (AUGMENTIN) 875-125 MG tablet Take 1 tablet by mouth every 12 (twelve) hours. 01/22/20   Particia Nearing, PA-C  cyclobenzaprine (FLEXERIL) 5 MG tablet Take 1-2 tablets 3 times daily as needed 01/21/18   Enid Derry, PA-C  ibuprofen (ADVIL,MOTRIN) 600 MG tablet Take 1 tablet (600 mg total) by mouth every 6 (six) hours as needed. 01/21/18   Enid Derry, PA-C  lidocaine (XYLOCAINE) 2 % solution Use as directed 15 mLs in the mouth or throat as needed for mouth pain. 01/22/20   Particia Nearing, PA-C  norethindrone (MICRONOR) 0.35 MG tablet Take 1 tablet (0.35 mg total) by mouth daily. 01/22/20   Particia Nearing, PA-C  nortriptyline (PAMELOR) 25 MG capsule Take  25 mg by mouth at bedtime.    [provider]    Allergies Patient has no known allergies.  Family History  Problem Relation Age of Onset   Hypertension Mother    Heart Problems Mother    Hypercholesterolemia Mother    Hypertension Father    Osteoarthritis Father     Social History Social History   Tobacco Use   Smoking status: Former    Packs/day: 0.25    Types: Cigarettes    Quit date: 02/15/2021    Years since quitting: 0.0   Smokeless tobacco: Never   Tobacco comments:    "trying to quit"  Vaping Use   Vaping Use: Every day   Substances: Nicotine, Flavoring  Substance Use Topics   Alcohol use: No   Drug use: Never     Review of Systems  Constitutional: No fever/chills Eyes:  No discharge ENT: No upper respiratory complaints. Respiratory: no cough. No SOB/ use of accessory muscles to breath Gastrointestinal:   No nausea, no vomiting.  No diarrhea.  No constipation. Musculoskeletal: Patient has left knee pain.  Skin: Negative for rash, abrasions, lacerations, ecchymosis.    ____________________________________________   PHYSICAL EXAM:  VITAL SIGNS: ED Triage Vitals  Enc Vitals Group     BP 03/13/21 1842 (!) 155/113     Pulse Rate 03/13/21 1842 77     Resp 03/13/21 1842 18  Temp 03/13/21 1842 98.5 F (36.9 C)     Temp Source 03/13/21 1842 Oral     SpO2 03/13/21 1842 100 %     Weight 03/13/21 1843 271 lb (122.9 kg)     Height 03/13/21 1843 5\' 6"  (1.676 m)     Head Circumference --      Peak Flow --      Pain Score 03/13/21 1842 10     Pain Loc --      Pain Edu? --      Excl. in GC? --      Constitutional: Alert and oriented. Well appearing and in no acute distress. Eyes: Conjunctivae are normal. PERRL. EOMI. Head: Atraumatic. ENT:      Nose: No congestion/rhinnorhea.      Mouth/Throat: Mucous membranes are moist.  Neck: No stridor.  No cervical spine tenderness to palpation. Cardiovascular: Normal rate, regular rhythm. Normal  S1 and S2.  Good peripheral circulation. Respiratory: Normal respiratory effort without tachypnea or retractions. Lungs CTAB. Good air entry to the bases with no decreased or absent breath sounds Gastrointestinal: Bowel sounds x 4 quadrants. Soft and nontender to palpation. No guarding or rigidity. No distention. Musculoskeletal: Full range of motion to all extremities. No obvious deformities noted Neurologic:  Normal for age. No gross focal neurologic deficits are appreciated.  Skin:  Skin is warm, dry and intact. No rash noted. Psychiatric: Mood and affect are normal for age. Speech and behavior are normal.   ____________________________________________   LABS (all labs ordered are listed, but only abnormal results are displayed)  Labs Reviewed - No data to display ____________________________________________  EKG   ____________________________________________  RADIOLOGY  No results found.  ____________________________________________    PROCEDURES  Procedure(s) performed:     Procedures     Medications - No data to display   ____________________________________________   INITIAL IMPRESSION / ASSESSMENT AND PLAN / ED COURSE  Pertinent labs & imaging results that were available during my care of the patient were reviewed by me and considered in my medical decision making (see chart for details).      Assessment and plan Multiple sclerosis 30 year old female presents to the emergency department for prescription for prednisone for symptoms relating to a MS flare.  Prednisone was prescribed as requested.  All patient questions were answered.       ____________________________________________  FINAL CLINICAL IMPRESSION(S) / ED DIAGNOSES  Final diagnoses:  MS (multiple sclerosis) (HCC)      NEW MEDICATIONS STARTED DURING THIS VISIT:  ED Discharge Orders          Ordered    predniSONE (STERAPRED UNI-PAK 21 TAB) 10 MG (21) TBPK tablet         03/13/21 1851                This chart was dictated using voice recognition software/Dragon. Despite best efforts to proofread, errors can occur which can change the meaning. Any change was purely unintentional.     03/15/21, PA-C 03/20/21 1532    03/22/21, MD 03/23/21 1143

## 2023-04-21 ENCOUNTER — Ambulatory Visit: Payer: Medicaid Other
